# Patient Record
Sex: Male | Born: 1970
Health system: Southern US, Community
[De-identification: ages and names within clinical notes are randomized; demographics above are authoritative.]

## PROBLEM LIST (undated history)

## (undated) DIAGNOSIS — G8929 Other chronic pain: Secondary | ICD-10-CM

## (undated) DIAGNOSIS — R519 Headache, unspecified: Secondary | ICD-10-CM

## (undated) DIAGNOSIS — M2550 Pain in unspecified joint: Secondary | ICD-10-CM

## (undated) DIAGNOSIS — Z8669 Personal history of other diseases of the nervous system and sense organs: Secondary | ICD-10-CM

## (undated) DIAGNOSIS — K219 Gastro-esophageal reflux disease without esophagitis: Secondary | ICD-10-CM

## (undated) DIAGNOSIS — M549 Dorsalgia, unspecified: Secondary | ICD-10-CM

## (undated) DIAGNOSIS — M1990 Unspecified osteoarthritis, unspecified site: Secondary | ICD-10-CM

## (undated) DIAGNOSIS — R51 Headache: Secondary | ICD-10-CM

## (undated) HISTORY — PX: KNEE SURGERY: SHX244

## (undated) HISTORY — PX: INGUINAL HERNIA REPAIR: SUR1180

---

## 2000-08-26 ENCOUNTER — Encounter: Payer: Self-pay | Admitting: Emergency Medicine

## 2000-08-26 ENCOUNTER — Emergency Department (HOSPITAL_COMMUNITY): Admission: EM | Admit: 2000-08-26 | Discharge: 2000-08-26 | Payer: Self-pay | Admitting: Emergency Medicine

## 2008-03-26 ENCOUNTER — Emergency Department (HOSPITAL_COMMUNITY): Admission: EM | Admit: 2008-03-26 | Discharge: 2008-03-27 | Payer: Self-pay | Admitting: Emergency Medicine

## 2009-09-07 ENCOUNTER — Emergency Department (HOSPITAL_COMMUNITY): Admission: EM | Admit: 2009-09-07 | Discharge: 2009-09-08 | Payer: Self-pay | Admitting: Emergency Medicine

## 2009-10-22 ENCOUNTER — Ambulatory Visit (HOSPITAL_BASED_OUTPATIENT_CLINIC_OR_DEPARTMENT_OTHER): Admission: RE | Admit: 2009-10-22 | Discharge: 2009-10-22 | Payer: Self-pay | Admitting: Orthopedic Surgery

## 2010-09-02 ENCOUNTER — Emergency Department: Payer: Self-pay | Admitting: Unknown Physician Specialty

## 2011-03-15 LAB — POCT HEMOGLOBIN-HEMACUE: Hemoglobin: 14.6 g/dL (ref 13.0–17.0)
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
## 2011-06-19 ENCOUNTER — Emergency Department (HOSPITAL_COMMUNITY)
Admission: EM | Admit: 2011-06-19 | Discharge: 2011-06-19 | Disposition: A | Discharge status: Home / Self Care | Payer: Self-pay | Attending: Emergency Medicine | Admitting: Emergency Medicine

## 2011-06-19 DIAGNOSIS — M545 Low back pain, unspecified: Secondary | ICD-10-CM | POA: Insufficient documentation

## 2011-06-27 ENCOUNTER — Emergency Department (HOSPITAL_COMMUNITY): Payer: Self-pay

## 2011-06-27 ENCOUNTER — Emergency Department (HOSPITAL_COMMUNITY)
Admission: EM | Admit: 2011-06-27 | Discharge: 2011-06-27 | Disposition: A | Discharge status: Home / Self Care | Payer: Self-pay | Attending: Emergency Medicine | Admitting: Emergency Medicine

## 2011-06-27 DIAGNOSIS — I498 Other specified cardiac arrhythmias: Secondary | ICD-10-CM | POA: Insufficient documentation

## 2011-06-27 DIAGNOSIS — M5126 Other intervertebral disc displacement, lumbar region: Secondary | ICD-10-CM | POA: Insufficient documentation

## 2011-09-05 LAB — POCT I-STAT, CHEM 8
Calcium, Ion: 1.11 — ABNORMAL LOW
Chloride: 104
HCT: 44
Sodium: 137
TCO2: 24

## 2011-09-05 LAB — CBC
HCT: 41.3
Hemoglobin: 14
RDW: 16.2 — ABNORMAL HIGH
WBC: 6.7

## 2011-09-05 LAB — TYPE AND SCREEN

## 2011-09-05 LAB — PROTIME-INR: INR: 1

## 2011-09-05 LAB — ABO/RH: ABO/RH(D): O POS

## 2015-05-26 ENCOUNTER — Emergency Department (HOSPITAL_COMMUNITY)
Admission: EM | Admit: 2015-05-26 | Discharge: 2015-05-26 | Disposition: A | Discharge status: Home / Self Care | Payer: Self-pay | Attending: Emergency Medicine | Admitting: Emergency Medicine

## 2015-05-26 ENCOUNTER — Emergency Department (HOSPITAL_COMMUNITY): Payer: Self-pay

## 2015-05-26 ENCOUNTER — Encounter (HOSPITAL_COMMUNITY): Payer: Self-pay | Admitting: *Deleted

## 2015-05-26 DIAGNOSIS — R05 Cough: Secondary | ICD-10-CM | POA: Insufficient documentation

## 2015-05-26 DIAGNOSIS — R079 Chest pain, unspecified: Secondary | ICD-10-CM | POA: Insufficient documentation

## 2015-05-26 DIAGNOSIS — Z720 Tobacco use: Secondary | ICD-10-CM | POA: Insufficient documentation

## 2015-05-26 DIAGNOSIS — R0689 Other abnormalities of breathing: Secondary | ICD-10-CM | POA: Insufficient documentation

## 2015-05-26 DIAGNOSIS — J029 Acute pharyngitis, unspecified: Secondary | ICD-10-CM | POA: Insufficient documentation

## 2015-05-26 LAB — CBC
HEMATOCRIT: 39.3 % (ref 39.0–52.0)
HEMOGLOBIN: 12.8 g/dL — AB (ref 13.0–17.0)
MCH: 28.9 pg (ref 26.0–34.0)
MCHC: 32.6 g/dL (ref 30.0–36.0)
MCV: 88.7 fL (ref 78.0–100.0)
Platelets: 227 10*3/uL (ref 150–400)
RBC: 4.43 MIL/uL (ref 4.22–5.81)
RDW: 14.8 % (ref 11.5–15.5)
WBC: 7.5 10*3/uL (ref 4.0–10.5)

## 2015-05-26 LAB — BASIC METABOLIC PANEL
ANION GAP: 7 (ref 5–15)
BUN: 14 mg/dL (ref 6–20)
CALCIUM: 9.3 mg/dL (ref 8.9–10.3)
CO2: 27 mmol/L (ref 22–32)
Chloride: 104 mmol/L (ref 101–111)
Creatinine, Ser: 0.93 mg/dL (ref 0.61–1.24)
GFR calc Af Amer: >60 mL/min (ref >60)
GLUCOSE: 100 mg/dL — AB (ref 65–99)
POTASSIUM: 4 mmol/L (ref 3.5–5.1)
SODIUM: 138 mmol/L (ref 135–145)

## 2015-05-26 LAB — I-STAT TROPONIN, ED: Troponin i, poc: 0 ng/mL (ref 0.00–0.08)

## 2015-05-26 LAB — D-DIMER, QUANTITATIVE: D-Dimer, Quant: 0.64 ug/mL-FEU — ABNORMAL HIGH (ref 0.00–0.48)

## 2015-05-26 MED ORDER — ONDANSETRON HCL 4 MG/2ML IJ SOLN
4.0000 mg | Freq: Once | INTRAMUSCULAR | Status: AC
  Administered 2015-05-26: 4 mg via INTRAVENOUS
  Filled 2015-05-26: qty 2

## 2015-05-26 MED ORDER — OXYCODONE HCL 5 MG PO TABS
5.0000 mg | ORAL_TABLET | Freq: Once | ORAL | Status: DC
Start: 2015-05-26 — End: 2015-05-26
  Filled 2015-05-26: qty 1

## 2015-05-26 MED ORDER — MORPHINE SULFATE 4 MG/ML IJ SOLN
4.0000 mg | Freq: Once | INTRAMUSCULAR | Status: AC
  Administered 2015-05-26: 4 mg via INTRAVENOUS
  Filled 2015-05-26: qty 1

## 2015-05-26 MED ORDER — GI COCKTAIL ~~LOC~~
30.0000 mL | Freq: Once | ORAL | Status: AC
  Administered 2015-05-26: 30 mL via ORAL
  Filled 2015-05-26: qty 30

## 2015-05-26 MED ORDER — OXYCODONE-ACETAMINOPHEN 5-325 MG PO TABS
1.0000 | ORAL_TABLET | Freq: Once | ORAL | Status: AC
  Administered 2015-05-26: 1 via ORAL
  Filled 2015-05-26: qty 1

## 2015-05-26 MED ORDER — TRAMADOL HCL 50 MG PO TABS: 50.0000 mg | ORAL_TABLET | Freq: Four times a day (QID) | ORAL | Status: DC | PRN

## 2015-05-26 MED ORDER — FAMOTIDINE 20 MG PO TABS: 20.0000 mg | ORAL_TABLET | Freq: Two times a day (BID) | ORAL | Status: DC

## 2015-05-26 MED ORDER — IOHEXOL 350 MG/ML SOLN
100.0000 mL | Freq: Once | INTRAVENOUS | Status: AC | PRN
  Administered 2015-05-26: 100 mL via INTRAVENOUS

## 2015-05-26 MED ORDER — SUCRALFATE 1 G PO TABS: 1.0000 g | ORAL_TABLET | Freq: Three times a day (TID) | ORAL | Status: DC

## 2015-05-26 NOTE — ED Provider Notes (Signed)
CSN: [id number]     Arrival date & time 05/26/15  1559 History   First MD Initiated Contact with Patient 05/26/15 1700     Chief Complaint  Patient presents with  . Chest Pain     (Consider location/radiation/quality/duration/timing/severity/associated sxs/prior Treatment) HPI Sergio Boyd is a 44 y.o. male with no medical problems presents to emergency department complaining of chest pain. Patient states that his pain started 3 days ago. It worsened today. He reports pain in the center of his chest, radiating into his neck and back. States pain is worse with movement, deep breathing, coughing, even with swallowing and eating. He states initially pain intermittent, now it is constant. Patient denies any swelling or pain in his extremities. He denies any recent travel or surgeries. He is a smoker. He has no prior medical problems and no family history of cardiac problems. He has not tried anything for pain. He rates pain as 9 out of 10. It is sharp. Denies associated shortness of breath. No nausea vomiting diarrhea. No cough.  History reviewed. No pertinent past medical history. Past Surgical History  Procedure Laterality Date  . Knee surgery      right ACL 2012   History reviewed. No pertinent family history. History  Substance Use Topics  . Smoking status: Never Smoker   . Smokeless tobacco: Not on file  . Alcohol Use: Yes    Review of Systems  Constitutional: Negative for fever and chills.  HENT: Positive for sore throat.   Respiratory: Positive for chest tightness. Negative for cough and shortness of breath.   Cardiovascular: Positive for chest pain. Negative for palpitations and leg swelling.  Gastrointestinal: Negative for nausea, vomiting, abdominal pain, diarrhea and abdominal distention.  Genitourinary: Negative for dysuria, urgency, frequency and hematuria.  Musculoskeletal: Negative for myalgias, arthralgias, neck pain and neck stiffness.  Skin: Negative for rash.   Allergic/Immunologic: Negative for immunocompromised state.  Neurological: Negative for dizziness, weakness, light-headedness, numbness and headaches.      Allergies  Tylenol  Home Medications   Prior to Admission medications   Medication Sig Start Date End Date Taking? Authorizing Provider  Tetrahydrozoline HCl (VISINE OP) Apply 1-2 drops to eye daily as needed (dry eyes).   Yes Historical Provider, MD   BP 128/71 mmHg  Pulse 70  Temp(Src) 98.2 F (36.8 C) (Oral)  Resp 19  SpO2 99% Physical Exam  Constitutional: He appears well-developed and well-nourished. No distress.  HENT:  Head: Normocephalic and atraumatic.  Eyes: Conjunctivae are normal.  Neck: Neck supple.  Cardiovascular: Normal rate, regular rhythm, normal heart sounds and intact distal pulses.   No murmur heard. Pulmonary/Chest: Effort normal. No respiratory distress. He has no wheezes. He has no rales. He exhibits no tenderness.  Abdominal: Soft. Bowel sounds are normal. He exhibits no distension. There is no tenderness. There is no rebound.  Musculoskeletal: He exhibits no edema.  Neurological: He is alert.  Skin: Skin is warm and dry.  Nursing note and vitals reviewed.   ED Course  Procedures (including critical care time) Labs Review Labs Reviewed  CBC - Abnormal; Notable for the following:    Hemoglobin 12.8 (*)    All other components within normal limits  BASIC METABOLIC PANEL  D-DIMER, QUANTITATIVE (NOT AT [facility])  Rosezena Sensor, ED    Imaging Review Dg Chest 2 View  05/26/2015   CLINICAL DATA:  Chest pain for 3 days.  EXAM: CHEST  2 VIEW  COMPARISON:  None.  FINDINGS: The heart  size and mediastinal contours are within normal limits. Both lungs are clear. The visualized skeletal structures are unremarkable.  IMPRESSION: Normal chest.   Electronically Signed   By: Francene Boyers M.D.   On: 05/26/2015 16:37     EKG Interpretation   Date/Time:  Wednesday May 26 2015 16:11:15  EDT Ventricular Rate:  56 PR Interval:  150 QRS Duration: 89 QT Interval:  406 QTC Calculation: 392 R Axis:   77 Text Interpretation:  Sinus rhythm Borderline T abnormalities, inferior  leads No old tracing to compare Confirmed by [facility]  MD, TREY ([id number]) on  05/26/2015 4:13:50 PM      MDM   Final diagnoses:  Chest pain, unspecified chest pain type    Pt with sharp central chest pain, constant, radiating into throat and back. VS normal. Pt otherwise healthy. Atypical for acs. Will get labs, chest xray, d dimer  6:52 PM D dimer slightly elevated. Discussed with Dr. Ethelda Chick. Will try GI cocktail first  No relief with GI cocktail. Percocet ordered. Will reassess.    8:55 PM Pain not improved with percocet or morphine. CT angio negative. Patient now states that pain is mainly worse when he is laying down flat and when he is swallowing. He states the pain is not in his throat but in his chest every time he swallows. I question whether this could be acid reflux or esophagitis, or gastritis. I discussed again with Dr. Rennis Chris who agrees patient is stable for discharge at this time. Patient's vital signs are stable, he is bradycardic, however sinus bradycardia with no apparent blocks. He is asymptomatic from bradycardia. Blood pressure is normal. I will start him on Pepcid, Carafate, Ultram, follow up with primary care doctor. Return precautions discussed.  Filed Vitals:   05/26/15 1615 05/26/15 2000 05/26/15 2100 05/26/15 2101  BP: 128/71 145/73  131/67  Pulse: 70 52 40 51  Temp: 98.2 F (36.8 C)     TempSrc: Oral     Resp: SpO2: 99% 100% 97% 99%     Jaynie Crumble, PA-C 05/27/15 0004  Doug Sou, MD 05/27/15 1610

## 2015-05-26 NOTE — ED Provider Notes (Signed)
Complains of chest pain anterior radiating to his back onset 2 days ago, constant, nonexertional. Made worse with deep inspiration or coughing or swallowing. Admits to mild shortness of breath. No other associated symptoms. Presently pain is moderate. On exam lungs clear auscultation heart regular rate and rhythm no murmurs rubs abdomen nondistended nontender extremities without edema. Heart score equals 2, story is highly atypical for acute coronary syndrome 6:30 PM pain not improved after treatment with GI cocktail. Percocet oredered  Doug Sou, MD 05/26/15 [phone]

## 2015-05-26 NOTE — ED Notes (Signed)
PA at bedside.

## 2015-05-26 NOTE — Discharge Instructions (Signed)
Start taking pepcid and carafate as prescribed daily. Tramadol for pain. See print out for possible acid reflex. If symptoms continue follow up with primary care doctor, if worsen, return to ER.    Chest Pain (Nonspecific) It is often hard to give a specific diagnosis for the cause of chest pain. There is always a chance that your pain could be related to something serious, such as a heart attack or a blood clot in the lungs. You need to follow up with your health care provider for further evaluation. CAUSES   Heartburn.  Pneumonia or bronchitis.  Anxiety or stress.  Inflammation around your heart (pericarditis) or lung (pleuritis or pleurisy).  A blood clot in the lung.  A collapsed lung (pneumothorax). It can develop suddenly on its own (spontaneous pneumothorax) or from trauma to the chest.  Shingles infection (herpes zoster virus). The chest wall is composed of bones, muscles, and cartilage. Any of these can be the source of the pain.  The bones can be bruised by injury.  The muscles or cartilage can be strained by coughing or overwork.  The cartilage can be affected by inflammation and become sore (costochondritis). DIAGNOSIS  Lab tests or other studies may be needed to find the cause of your pain. Your health care provider may have you take a test called an ambulatory electrocardiogram (ECG). An ECG records your heartbeat patterns over a 24-hour period. You may also have other tests, such as:  Transthoracic echocardiogram (TTE). During echocardiography, sound waves are used to evaluate how blood flows through your heart.  Transesophageal echocardiogram (TEE).  Cardiac monitoring. This allows your health care provider to monitor your heart rate and rhythm in real time.  Holter monitor. This is a portable device that records your heartbeat and can help diagnose heart arrhythmias. It allows your health care provider to track your heart activity for several days, if  needed.  Stress tests by exercise or by giving medicine that makes the heart beat faster. TREATMENT   Treatment depends on what may be causing your chest pain. Treatment may include:  Acid blockers for heartburn.  Anti-inflammatory medicine.  Pain medicine for inflammatory conditions.  Antibiotics if an infection is present.  You may be advised to change lifestyle habits. This includes stopping smoking and avoiding alcohol, caffeine, and chocolate.  You may be advised to keep your head raised (elevated) when sleeping. This reduces the chance of acid going backward from your stomach into your esophagus. Most of the time, nonspecific chest pain will improve within 2-3 days with rest and mild pain medicine.  HOME CARE INSTRUCTIONS   If antibiotics were prescribed, take them as directed. Finish them even if you start to feel better.  For the next few days, avoid physical activities that bring on chest pain. Continue physical activities as directed.  Do not use any tobacco products, including cigarettes, chewing tobacco, or electronic cigarettes.  Avoid drinking alcohol.  Only take medicine as directed by your health care provider.  Follow your health care provider's suggestions for further testing if your chest pain does not go away.  Keep any follow-up appointments you made. If you do not go to an appointment, you could develop lasting (chronic) problems with pain. If there is any problem keeping an appointment, call to reschedule. SEEK MEDICAL CARE IF:   Your chest pain does not go away, even after treatment.  You have a rash with blisters on your chest.  You have a fever. SEEK IMMEDIATE MEDICAL CARE  IF:   You have increased chest pain or pain that spreads to your arm, neck, jaw, back, or abdomen.  You have shortness of breath.  You have an increasing cough, or you cough up blood.  You have severe back or abdominal pain.  You feel nauseous or vomit.  You have severe  weakness.  You faint.  You have chills. This is an emergency. Do not wait to see if the pain will go away. Get medical help at once. Call your local emergency services (911 in [place] Do not drive yourself to the hospital. MAKE SURE YOU:   Understand these instructions.  Will watch your condition.  Will get help right away if you are not doing well or get worse. Document Released: 09/06/2005 Document Revised: 12/02/2013 Document Reviewed: 07/02/2008 [facility] Patient Information 2015 [place], [place]. This information is not intended to replace advice given to you by your health care provider. Make sure you discuss any questions you have with your health care provider.

## 2015-05-26 NOTE — ED Notes (Signed)
Provider aware of HR between 40 -50's. SBP is 145/73. Pt is assymptomatic. Denies dizziness. Also states he does not workout.

## 2015-05-26 NOTE — ED Notes (Signed)
Pt reports central chest pain x3 days, pain 9/10. Denies n/v/d. Denies cough.

## 2015-05-26 NOTE — ED Notes (Signed)
Patient transported to CT 

## 2015-10-20 ENCOUNTER — Encounter: Payer: Self-pay | Admitting: Medical

## 2015-10-20 ENCOUNTER — Ambulatory Visit (INDEPENDENT_AMBULATORY_CARE_PROVIDER_SITE_OTHER): Payer: PRIVATE HEALTH INSURANCE | Admitting: Medical

## 2015-10-20 VITALS — BP 106/80 | HR 62 | Resp 12 | Wt 216.8 lb

## 2015-10-20 DIAGNOSIS — R1031 Right lower quadrant pain: Secondary | ICD-10-CM

## 2015-10-20 DIAGNOSIS — K4090 Unilateral inguinal hernia, without obstruction or gangrene, not specified as recurrent: Secondary | ICD-10-CM | POA: Diagnosis not present

## 2015-10-20 NOTE — Progress Notes (Signed)
Subjective: Chief Complaint  Patient presents with  . Groin pain    sudden onset, "goin on for weeks". haven't taken anything for pain. coughing, walking, sitting up, etc. hurts. Laying down doesn't hurt as bed. Loses sleep sometimes because of it. overall about a 7/10 pain scale   Here as a new patient.   Hasn't been having routine primary care, just would go to the ED for issues.    He notes several week hx/o pain in groin, thinks he has a hernia.   Can get pain with coughing, walking, sitting up.   Sees bulge.   denies any problems with bowels, had daily BM.   No blood or mucous in stool.   No urinary problems, no blood in urine, no odor in urine.  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   Does some heavy lifting on the job occasionally but usually gets somebody to help him.  No fever, no recent weight change.   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  No other aggravating or relieving factors. No other complaint.  Objective: BP 106/80 mmHg  Pulse 62  Resp 12  Wt 216 lb 12.8 oz (98.34 kg)  Gen: wd, wn, nad., AA male Lungs clear Heart RRR normal s1, s2, no murmurs Ext: no edema Abdomen:+bs, soft, right inguinal region with obvious bulge and fullness, tender in same area c/w hernia, otherwise no mass, no organomegaly GU: normal circumcised male, bulge of intestines palpated and tender down into right scrotal sac.  otherwise normal male exam, testes descend without mass, no inguinal lymphadenopathy   Assessment: Encounter Diagnoses  Name Primary?  . Inguinal hernia, right Yes  . Right lower quadrant abdominal pain      Plan: discussed findings.   He does have a symptomatic large right inguinal hernia.   Refer to general surgery ASAP

## 2015-11-08 ENCOUNTER — Ambulatory Visit: Payer: Self-pay | Admitting: General Surgery

## 2015-11-08 NOTE — H&P (Addendum)
Sergio Boyd 11/08/2015 3:46 PM Location: [facility] Patient #: [id number] DOB: 07/11/1971 Single / Language: English / Race: Black or African American Male   History of Present Illness (Sergio Boyd Sergio Boyd; 11/08/2015 4:22 PM) The patient is a 44 year old male.  Note:He is referred by Sergio Boyd, physician's Assistant, because of an enlarging right inguinal hernia. He is noted a bulge for at least a month or 2 that is now extended down into the right hemiscrotum. It's been causing him some groin and lower abdominal pain. No constipation or difficulty with urination. No previous history of hernia. No known family history of hernia disease. He occasionally does some heavy lifting at work. 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  Other Problems (Sergio Boyd, CMA; 11/08/2015 3:46 PM) Back Pain  Past Surgical History (Sergio Boyd, CMA; 11/08/2015 3:46 PM) Knee Surgery Right.  Diagnostic Studies History (Sergio Boyd, CMA; 11/08/2015 3:46 PM) Colonoscopy never  Allergies (Sergio Boyd, CMA; 11/08/2015 3:46 PM) Tylenol *ANALGESICS - NonNarcotic* Nausea, Vomiting.  Medication History (Sergio Boyd, CMA; 11/08/2015 3:46 PM) No Current Medications Medications Reconciled  Social History (Sergio Boyd, CMA; 11/08/2015 3:46 PM) Alcohol use Occasional alcohol use. Caffeine use Tea. No drug use Tobacco use Current some day smoker.  Family History (Sergio Boyd, CMA; 11/08/2015 3:46 PM) First Degree Relatives No pertinent family history    Review of Systems (Sergio Boyd CMA; 11/08/2015 3:46 PM) General Not Present- Appetite Loss, Chills, Fatigue, Fever, Night Sweats, Weight Gain and Weight Loss. Skin Not Present- Change in Wart/Mole, Dryness, Hives, Jaundice, New Lesions, Non-Healing Wounds, Rash and Ulcer. HEENT Not Present- Earache, Hearing Loss, Hoarseness, Nose Bleed, Oral Ulcers, Ringing in the Ears, Seasonal Allergies, Sinus Pain, Sore Throat, Visual  Disturbances, Wears glasses/contact lenses and Yellow Eyes. Respiratory Not Present- Bloody sputum, Chronic Cough, Difficulty Breathing, Snoring and Wheezing. Breast Not Present- Breast Mass, Breast Pain, Nipple Discharge and Skin Changes. Cardiovascular Not Present- Chest Pain, Difficulty Breathing Lying Down, Leg Cramps, Palpitations, Rapid Heart Rate, Shortness of Breath and Swelling of Extremities. Gastrointestinal Not Present- Abdominal Pain, Bloating, Bloody Stool, Change in Bowel Habits, Chronic diarrhea, Constipation, Difficulty Swallowing, Excessive gas, Gets full quickly at meals, Hemorrhoids, Indigestion, Nausea, Rectal Pain and Vomiting. Male Genitourinary Not Present- Blood in Urine, Change in Urinary Stream, Frequency, Impotence, Nocturia, Painful Urination, Urgency and Urine Leakage. Musculoskeletal Present- Back Pain and Joint Stiffness. Not Present- Joint Pain, Muscle Pain, Muscle Weakness and Swelling of Extremities. Neurological Not Present- Decreased Memory, Fainting, Headaches, Numbness, Seizures, Tingling, Tremor, Trouble walking and Weakness. Psychiatric Not Present- Anxiety, Bipolar, Change in Sleep Pattern, Depression, Fearful and Frequent crying. Endocrine Not Present- Cold Intolerance, Excessive Hunger, Hair Changes, Heat Intolerance, Hot flashes and New Diabetes. Hematology Not Present- Easy Bruising, Excessive bleeding, Gland problems, HIV and Persistent Infections.  Vitals (Sergio Boyd CMA; 11/08/2015 3:47 PM) 11/08/2015 3:46 PM Weight: 220.8 lb Height: 70in Body Surface Area: 2.18 m Body Mass Index: 31.68 kg/m  Temp.: 97.5F(Temporal)  Pulse: 78 (Regular)  BP: 128/80 (Sitting, Left Arm, Standard)       Physical Exam (Sergio Isley J. Sergio Boyd; 11/08/2015 4:23 PM) The physical exam findings are as follows: Note:General: Overweight male in NAD. Pleasant and cooperative.  HEENT: /AT, no facial masses  CV: RRR.  CHEST: Breath sounds equal and  clear. Respirations nonlabored.  ABDOMEN: Soft, nontender, no hernias.  GU: Right inguinal bulge that extends down into the scrotum and is reducible, no left inguinal bulge. No testicular masses.  NEUROLOGIC: Alert and   oriented, answers questions appropriately, normal gait and station.  PSYCHIATRIC: Normal mood, affect , and behavior.    Assessment & Plan Sergio Boyd(Sergio Boyd J. Sergio Boyd; 11/08/2015 4:25 PM) RIGHT INGUINAL HERNIA (K40.90) Impression: This is symptomatic and enlarging. It extends down into the scrotum.  Plan: I recommended open right inguinal hernia repair with mesh as an outpatient. I have asked him to stop smoking as this will decrease the risk of wound healing problems and infection rate. I have explained the procedure, risks, and aftercare of inguinal hernia repair. Risks include but are not limited to bleeding, infection, wound problems, anesthesia, recurrence, bladder or intestine injury, urinary retention, testicular dysfunction, chronic pain, mesh problems. He seems to understand and agrees to proceed.  Sergio Peaceodd Makaya [place], Boyd

## 2015-11-22 ENCOUNTER — Encounter (HOSPITAL_COMMUNITY): Payer: Self-pay | Admitting: *Deleted

## 2015-11-23 ENCOUNTER — Ambulatory Visit (HOSPITAL_COMMUNITY)
Admission: RE | Admit: 2015-11-23 | Discharge: 2015-11-23 | Disposition: A | Discharge status: Home / Self Care | Payer: PRIVATE HEALTH INSURANCE | Source: Ambulatory Visit | Attending: General Surgery | Admitting: General Surgery

## 2015-11-23 ENCOUNTER — Ambulatory Visit (HOSPITAL_COMMUNITY): Payer: PRIVATE HEALTH INSURANCE | Admitting: Anesthesiology

## 2015-11-23 ENCOUNTER — Encounter (HOSPITAL_COMMUNITY): Payer: Self-pay | Admitting: Anesthesiology

## 2015-11-23 ENCOUNTER — Other Ambulatory Visit: Payer: Self-pay | Admitting: Surgery

## 2015-11-23 ENCOUNTER — Encounter (HOSPITAL_COMMUNITY)
Admission: RE | Disposition: A | Discharge status: Home / Self Care | Payer: Self-pay | Source: Ambulatory Visit | Attending: General Surgery

## 2015-11-23 DIAGNOSIS — K4090 Unilateral inguinal hernia, without obstruction or gangrene, not specified as recurrent: Secondary | ICD-10-CM | POA: Diagnosis not present

## 2015-11-23 DIAGNOSIS — F17200 Nicotine dependence, unspecified, uncomplicated: Secondary | ICD-10-CM | POA: Diagnosis not present

## 2015-11-23 HISTORY — PX: INSERTION OF MESH: SHX5868

## 2015-11-23 HISTORY — DX: Dorsalgia, unspecified: M54.9

## 2015-11-23 HISTORY — PX: INGUINAL HERNIA REPAIR: SHX194

## 2015-11-23 HISTORY — DX: Personal history of other diseases of the nervous system and sense organs: Z86.69

## 2015-11-23 HISTORY — DX: Pain in unspecified joint: M25.50

## 2015-11-23 HISTORY — DX: Other chronic pain: G89.29

## 2015-11-23 LAB — BASIC METABOLIC PANEL
Anion gap: 6 (ref 5–15)
BUN: 10 mg/dL (ref 6–20)
CALCIUM: 9.1 mg/dL (ref 8.9–10.3)
CO2: 26 mmol/L (ref 22–32)
CREATININE: 0.99 mg/dL (ref 0.61–1.24)
Chloride: 107 mmol/L (ref 101–111)
GFR calc Af Amer: >60 mL/min (ref >60)
GFR calc non Af Amer: >60 mL/min (ref >60)
GLUCOSE: 97 mg/dL (ref 65–99)
Potassium: 4.5 mmol/L (ref 3.5–5.1)
Sodium: 139 mmol/L (ref 135–145)

## 2015-11-23 SURGERY — REPAIR, HERNIA, INGUINAL, ADULT
Anesthesia: General | Site: Groin | Laterality: Right

## 2015-11-23 MED ORDER — EPHEDRINE SULFATE 50 MG/ML IJ SOLN
INTRAMUSCULAR | Status: DC | PRN
  Administered 2015-11-23: 5 mg via INTRAVENOUS

## 2015-11-23 MED ORDER — LIDOCAINE HCL (CARDIAC) 20 MG/ML IV SOLN
INTRAVENOUS | Status: DC | PRN
  Administered 2015-11-23: 100 mg via INTRAVENOUS

## 2015-11-23 MED ORDER — LACTATED RINGERS IV SOLN
INTRAVENOUS | Status: DC
  Administered 2015-11-23 (×3): via INTRAVENOUS

## 2015-11-23 MED ORDER — PROMETHAZINE HCL 25 MG/ML IJ SOLN: 6.2500 mg | INTRAMUSCULAR | Status: DC | PRN

## 2015-11-23 MED ORDER — MEPERIDINE HCL 25 MG/ML IJ SOLN: 6.2500 mg | INTRAMUSCULAR | Status: DC | PRN

## 2015-11-23 MED ORDER — SODIUM CHLORIDE 0.9 % IJ SOLN: 3.0000 mL | INTRAMUSCULAR | Status: DC | PRN

## 2015-11-23 MED ORDER — OXYCODONE HCL 5 MG PO TABS: 5.0000 mg | ORAL_TABLET | ORAL | Status: DC | PRN

## 2015-11-23 MED ORDER — HYDROMORPHONE HCL 1 MG/ML IJ SOLN
INTRAMUSCULAR | Status: AC
  Filled 2015-11-23: qty 1

## 2015-11-23 MED ORDER — OXYCODONE HCL 5 MG/5ML PO SOLN: 5.0000 mg | Freq: Once | ORAL | Status: AC | PRN

## 2015-11-23 MED ORDER — PROPOFOL 10 MG/ML IV BOLUS
INTRAVENOUS | Status: AC
  Filled 2015-11-23: qty 20

## 2015-11-23 MED ORDER — ONDANSETRON HCL 4 MG/2ML IJ SOLN
INTRAMUSCULAR | Status: DC | PRN
  Administered 2015-11-23: 4 mg via INTRAVENOUS

## 2015-11-23 MED ORDER — LIDOCAINE HCL (CARDIAC) 20 MG/ML IV SOLN
INTRAVENOUS | Status: AC
  Filled 2015-11-23: qty 5

## 2015-11-23 MED ORDER — FENTANYL CITRATE (PF) 250 MCG/5ML IJ SOLN
INTRAMUSCULAR | Status: AC
  Filled 2015-11-23: qty 5

## 2015-11-23 MED ORDER — FENTANYL CITRATE (PF) 100 MCG/2ML IJ SOLN
50.0000 ug | INTRAMUSCULAR | Status: AC | PRN
  Administered 2015-11-23: 100 ug via INTRAVENOUS
  Administered 2015-11-23: 50 ug via INTRAVENOUS
  Administered 2015-11-23: 100 ug via INTRAVENOUS

## 2015-11-23 MED ORDER — ROCURONIUM BROMIDE 50 MG/5ML IV SOLN
INTRAVENOUS | Status: AC
  Filled 2015-11-23: qty 1

## 2015-11-23 MED ORDER — MIDAZOLAM HCL 5 MG/5ML IJ SOLN
INTRAMUSCULAR | Status: DC | PRN
  Administered 2015-11-23: 2 mg via INTRAVENOUS

## 2015-11-23 MED ORDER — FENTANYL CITRATE (PF) 100 MCG/2ML IJ SOLN
INTRAMUSCULAR | Status: AC
  Administered 2015-11-23: 100 ug via INTRAVENOUS
  Filled 2015-11-23: qty 2

## 2015-11-23 MED ORDER — ONDANSETRON HCL 4 MG/2ML IJ SOLN
INTRAMUSCULAR | Status: AC
  Filled 2015-11-23: qty 2

## 2015-11-23 MED ORDER — OXYCODONE HCL 5 MG PO TABS
5.0000 mg | ORAL_TABLET | Freq: Once | ORAL | Status: AC | PRN
  Administered 2015-11-23: 5 mg via ORAL

## 2015-11-23 MED ORDER — 0.9 % SODIUM CHLORIDE (POUR BTL) OPTIME
TOPICAL | Status: DC | PRN
  Administered 2015-11-23: 1000 mL

## 2015-11-23 MED ORDER — PROPOFOL 10 MG/ML IV BOLUS
INTRAVENOUS | Status: DC | PRN
  Administered 2015-11-23: 200 mg via INTRAVENOUS

## 2015-11-23 MED ORDER — MIDAZOLAM HCL 2 MG/2ML IJ SOLN
1.0000 mg | INTRAMUSCULAR | Status: DC | PRN
  Administered 2015-11-23: 2 mg via INTRAVENOUS

## 2015-11-23 MED ORDER — MIDAZOLAM HCL 2 MG/2ML IJ SOLN
INTRAMUSCULAR | Status: AC
  Filled 2015-11-23: qty 2

## 2015-11-23 MED ORDER — CEFAZOLIN SODIUM-DEXTROSE 2-3 GM-% IV SOLR
2.0000 g | INTRAVENOUS | Status: AC
  Administered 2015-11-23: 2 g via INTRAVENOUS
  Filled 2015-11-23: qty 50

## 2015-11-23 MED ORDER — OXYCODONE HCL 5 MG PO TABS
ORAL_TABLET | ORAL | Status: AC
  Filled 2015-11-23: qty 1

## 2015-11-23 MED ORDER — BUPIVACAINE-EPINEPHRINE (PF) 0.5% -1:200000 IJ SOLN
INTRAMUSCULAR | Status: AC
  Filled 2015-11-23: qty 30

## 2015-11-23 MED ORDER — HYDROMORPHONE HCL 1 MG/ML IJ SOLN
0.2500 mg | INTRAMUSCULAR | Status: DC | PRN
  Administered 2015-11-23 (×2): 0.5 mg via INTRAVENOUS

## 2015-11-23 MED ORDER — BUPIVACAINE-EPINEPHRINE 0.5% -1:200000 IJ SOLN
INTRAMUSCULAR | Status: DC | PRN
  Administered 2015-11-23: 30 mL

## 2015-11-23 MED ORDER — MIDAZOLAM HCL 2 MG/2ML IJ SOLN
INTRAMUSCULAR | Status: AC
  Administered 2015-11-23: 2 mg via INTRAVENOUS
  Filled 2015-11-23: qty 2

## 2015-11-23 SURGICAL SUPPLY — 49 items
APL SKNCLS STERI-STRIP NONHPOA (GAUZE/BANDAGES/DRESSINGS) ×1
BENZOIN TINCTURE PRP APPL 2/3 (GAUZE/BANDAGES/DRESSINGS) ×2 IMPLANT
BLADE SURG 10 STRL SS (BLADE) ×2 IMPLANT
BLADE SURG 15 STRL LF DISP TIS (BLADE) ×1 IMPLANT
BLADE SURG 15 STRL SS (BLADE) ×2
CHLORAPREP W/TINT 26ML (MISCELLANEOUS) ×2 IMPLANT
COVER SURGICAL LIGHT HANDLE (MISCELLANEOUS) ×2 IMPLANT
DRAPE INCISE IOBAN 66X45 STRL (DRAPES) ×2 IMPLANT
DRAPE LAPAROTOMY TRNSV 102X78 (DRAPE) ×2 IMPLANT
DRAPE UTILITY XL STRL (DRAPES) ×4 IMPLANT
DRSG TEGADERM 4X4.75 (GAUZE/BANDAGES/DRESSINGS) ×2 IMPLANT
DRSG TELFA 3X8 NADH (GAUZE/BANDAGES/DRESSINGS) ×2 IMPLANT
ELECT CAUTERY BLADE 6.4 (BLADE) ×2 IMPLANT
ELECT REM PT RETURN 9FT ADLT (ELECTROSURGICAL) ×2
ELECTRODE REM PT RTRN 9FT ADLT (ELECTROSURGICAL) ×1 IMPLANT
GAUZE SPONGE 4X4 16PLY XRAY LF (GAUZE/BANDAGES/DRESSINGS) ×2 IMPLANT
GLOVE BIOGEL PI IND STRL 7.5 (GLOVE) IMPLANT
GLOVE BIOGEL PI IND STRL 8 (GLOVE) ×1 IMPLANT
GLOVE BIOGEL PI IND STRL 8.5 (GLOVE) IMPLANT
GLOVE BIOGEL PI INDICATOR 7.5 (GLOVE) ×1
GLOVE BIOGEL PI INDICATOR 8 (GLOVE) ×1
GLOVE BIOGEL PI INDICATOR 8.5 (GLOVE) ×1
GLOVE ECLIPSE 8.0 STRL XLNG CF (GLOVE) ×2 IMPLANT
GLOVE SURG SS PI 7.5 STRL IVOR (GLOVE) ×1 IMPLANT
GLOVE SURG SS PI 8.0 STRL IVOR (GLOVE) ×2 IMPLANT
GOWN STRL REUS W/ TWL LRG LVL3 (GOWN DISPOSABLE) ×2 IMPLANT
GOWN STRL REUS W/TWL LRG LVL3 (GOWN DISPOSABLE) ×4
KIT BASIN OR (CUSTOM PROCEDURE TRAY) ×2 IMPLANT
KIT ROOM TURNOVER OR (KITS) ×2 IMPLANT
MESH HERNIA 3X6 (Mesh General) ×1 IMPLANT
NDL HYPO 25GX1X1/2 BEV (NEEDLE) ×1 IMPLANT
NEEDLE HYPO 25GX1X1/2 BEV (NEEDLE) ×2 IMPLANT
NS IRRIG 1000ML POUR BTL (IV SOLUTION) ×2 IMPLANT
PACK SURGICAL SETUP 50X90 (CUSTOM PROCEDURE TRAY) ×2 IMPLANT
PAD ARMBOARD 7.5X6 YLW CONV (MISCELLANEOUS) ×2 IMPLANT
PAD DRESSING TELFA 3X8 NADH (GAUZE/BANDAGES/DRESSINGS) ×1 IMPLANT
PENCIL BUTTON HOLSTER BLD 10FT (ELECTRODE) ×2 IMPLANT
SPONGE LAP 18X18 X RAY DECT (DISPOSABLE) ×2 IMPLANT
STRIP CLOSURE SKIN 1/2X4 (GAUZE/BANDAGES/DRESSINGS) ×2 IMPLANT
SUT MON AB 4-0 PC3 18 (SUTURE) ×2 IMPLANT
SUT PROLENE 2 0 CT2 30 (SUTURE) ×4 IMPLANT
SUT VIC AB 2-0 SH 18 (SUTURE) ×2 IMPLANT
SUT VIC AB 3-0 SH 27 (SUTURE) ×4
SUT VIC AB 3-0 SH 27X BRD (SUTURE) IMPLANT
SUT VIC AB 3-0 SH 27XBRD (SUTURE) ×1 IMPLANT
SUT VICRYL AB 3 0 TIES (SUTURE) ×2 IMPLANT
SYR CONTROL 10ML LL (SYRINGE) ×2 IMPLANT
TOWEL OR 17X24 6PK STRL BLUE (TOWEL DISPOSABLE) ×2 IMPLANT
TOWEL OR 17X26 10 PK STRL BLUE (TOWEL DISPOSABLE) ×2 IMPLANT

## 2015-11-23 NOTE — Op Note (Signed)
OPERATIVE NOTE-INGUINAL HERNIA REPAIR  Preoperative diagnosis:  Right inguinal hernia extending into scrotum  Postoperative diagnosis:  Same  Procedure:  Right inguinal hernia repair with mesh.  Surgeon:  Avel Peaceodd Shafin Pollio, M.D.  Anesthesia:  Gen./LMA with local (Marcaine).  Indication:  This is a 44 year old male who has a right inguinal hernia extending down into his scrotum. He now presents for repair. He had a TAP block in the holding room.  Technique:  He was seen in the holding room and the right groin was marked with my initials. He was brought to the operating, placed supine on the operating table, and the anesthetic was administered by the anesthesiologist. The hair in the right groin area was clipped as was felt to be necessary. This area was then sterilely prepped and draped.  Local anesthetic was infiltrated in the superficial and deep tissues in the right groin.  An incision was made through the skin and subcutaneous tissue until the external oblique aponeurosis was identified.  Local anesthetic was infiltrated deep to the external oblique aponeurosis. The external oblique aponeurosis was divided through the external ring medially and back toward the anterior superior iliac spine laterally. Using blunt dissection, the shelving edge of the inguinal ligament was identified inferiorly and the internal oblique aponeurosis and muscle were identified superiorly. The ilioinguinal nerve was identified and preserved.  The spermatic cord was isolated and a posterior window was made around it. An indirect hernia defect was noted with a large hernia sac densely adherent to the spermatic cord and extending down into the scrotum.  The sac separated from the spermatic cord using blunt dissection. The hernia sac and its contents were reduced through the indirect hernia defect.   A piece of 3" x 6" polypropylene mesh was brought into the field and anchored 1-2 cm medial to the pubic tubercle with 2-0  Prolene suture. The inferior aspect of the mesh was anchored to the shelving edge of the inguinal ligament with running 2-0 Prolene suture to a level 1-2 cm lateral to the internal ring. A slit was cut in the mesh creating 2 tails. These were wrapped around the spermatic cord. The superior aspect of the mesh was anchored to the internal oblique aponeurosis and muscle with interrupted 2-0 Vicryl sutures. The 2 tails of the mesh were then crossed creating a new internal ring and were anchored to the shelving edge of the inguinal ligament with 2-0 Prolene suture. The tip of a hemostat could be placed through the new aperture. The lateral aspect of the mesh was then tucked deep to the external oblique aponeurosis.  The wound was inspected and hemostasis was adequate. The external oblique aponeurosis was then closed over the mesh and cord with running 3-0 Vicryl suture. The subcutaneous tissue was closed with running 3-0 Vicryl suture. The skin closed with a running 4-0 Monocryl subcuticular stitch.  Steri-Strips and a sterile dressing were applied.  The procedure was well-tolerated without any apparent complications and he was taken to the recovery room in satisfactory condition.

## 2015-11-23 NOTE — Interval H&P Note (Signed)
History and Physical Interval Note:  11/23/2015 12:25 PM  Sergio BeaversMiguel Hammontree  has presented today for surgery, with the diagnosis of right inguinal hernia repair with mesh  The various methods of treatment have been discussed with the patient and family. After consideration of risks, benefits and other options for treatment, the patient has consented to  Procedure(s): RIGHT INGUINAL HERNIA REPAIR W/MESH (Right) INSERTION OF MESH (N/A) as a surgical intervention .  The patient's history has been reviewed, patient examined, no change in status, stable for surgery.  I have reviewed the patient's chart and labs.  Questions were answered to the patient's satisfaction.     Zephan Beauchaine JShela Commons

## 2015-11-23 NOTE — Transfer of Care (Signed)
Immediate Anesthesia Transfer of Care Note  Patient: Sergio BeaversMiguel Boyd  Procedure(s) Performed: Procedure(s): RIGHT INGUINAL HERNIA REPAIR W/MESH (Right) INSERTION OF MESH (Right)  Patient Location: PACU  Anesthesia Type:GA combined with regional for post-op pain  Level of Consciousness: awake, alert  and oriented  Airway & Oxygen Therapy: Patient Spontanous Breathing and Patient connected to nasal cannula oxygen  Post-op Assessment: Report given to RN and Post -op Vital signs reviewed and stable  Post vital signs: Reviewed and stable  Last Vitals:  Filed Vitals:   11/23/15 1250 11/23/15 1437  BP: 113/65 118/49  Pulse:  57  Temp:  36.5 C  Resp: 18 17    Complications: No apparent anesthesia complications

## 2015-11-23 NOTE — Anesthesia Postprocedure Evaluation (Signed)
Anesthesia Post Note  Patient: Sergio BeaversMiguel Boyd  Procedure(s) Performed: Procedure(s) (LRB): RIGHT INGUINAL HERNIA REPAIR W/MESH (Right) INSERTION OF MESH (Right)  Patient location during evaluation: PACU Anesthesia Type: General and Regional Level of consciousness: sedated and patient cooperative Pain management: pain level controlled Vital Signs Assessment: post-procedure vital signs reviewed and stable Respiratory status: spontaneous breathing Cardiovascular status: stable Anesthetic complications: no    Last Vitals:  Filed Vitals:   11/23/15 1445 11/23/15 1500  BP: 114/80 115/75  Pulse: 49 45  Temp:    Resp: 10 14    Last Pain:  Filed Vitals:   11/23/15 1514  PainSc: Asleep                 Lewie LoronJohn Jannelly Bergren

## 2015-11-23 NOTE — Anesthesia Preprocedure Evaluation (Addendum)
Anesthesia Evaluation  Patient identified by MRN, date of birth, ID band Patient awake    Reviewed: Allergy & Precautions, NPO status , Patient's Chart, lab work & pertinent test results  Airway Mallampati: II  TM Distance: >3 FB Neck ROM: Full    Dental no notable dental hx.    Pulmonary Current Smoker,    Pulmonary exam normal breath sounds clear to auscultation       Cardiovascular negative cardio ROS Normal cardiovascular exam Rhythm:Regular Rate:Normal     Neuro/Psych negative neurological ROS  negative psych ROS   GI/Hepatic negative GI ROS, Neg liver ROS,   Endo/Other  negative endocrine ROS  Renal/GU negative Renal ROS     Musculoskeletal negative musculoskeletal ROS (+)   Abdominal   Peds  Hematology negative hematology ROS (+)   Anesthesia Other Findings   Reproductive/Obstetrics                             Anesthesia Physical Anesthesia Plan  ASA: II  Anesthesia Plan: General   Post-op Pain Management:    Induction: Intravenous  Airway Management Planned: LMA  Additional Equipment:   Intra-op Plan:   Post-operative Plan: Extubation in OR  Informed Consent: I have reviewed the patients History and Physical, chart, labs and discussed the procedure including the risks, benefits and alternatives for the proposed anesthesia with the patient or authorized representative who has indicated his/her understanding and acceptance.   Dental advisory given  Plan Discussed with: CRNA  Anesthesia Plan Comments:         Anesthesia Quick Evaluation

## 2015-11-23 NOTE — H&P (View-Only) (Signed)
[place]. [place] 11/08/2015 3:46 PM Location: [facility] Patient #: [id number] DOB: 12-Dec-1970 Single / Language: [place] / Race: Black or African American Male   History of Present Illness [name] MD; 11/08/2015 4:22 PM) The patient is a 44 year old male.  Note:He is referred by Crosby Oyster, physician's Assistant, because of an enlarging right inguinal hernia. He is noted a bulge for at least a month or 2 that is now extended down into the right hemiscrotum. It's been causing him some groin and lower abdominal pain. No constipation or difficulty with urination. No previous history of hernia. No known family history of hernia disease. He occasionally does some heavy lifting at work. 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  Other Problems Fay Records, CMA; 11/08/2015 3:46 PM) Back Pain  Past Surgical History Fay Records, CMA; 11/08/2015 3:46 PM) Knee Surgery Right.  Diagnostic Studies History Fay Records, [place]; 11/08/2015 3:46 PM) Colonoscopy never  Allergies Fay Records, CMA; 11/08/2015 3:46 PM) Tylenol *ANALGESICS - NonNarcotic* Nausea, Vomiting.  Medication History Fay Records, [place]; 11/08/2015 3:46 PM) No Current Medications Medications Reconciled  Social History Fay Records, [place]; 11/08/2015 3:46 PM) Alcohol use Occasional alcohol use. Caffeine use Tea. No drug use Tobacco use Current some day smoker.  Family History Fay Records, [place]; 11/08/2015 3:46 PM) First Degree Relatives No pertinent family history    Review of Systems Fay Records CMA; 11/08/2015 3:46 PM) General Not Present- Appetite Loss, Chills, Fatigue, Fever, Night Sweats, Weight Gain and Weight Loss. Skin Not Present- Change in Wart/Mole, Dryness, Hives, Jaundice, New Lesions, Non-Healing Wounds, Rash and Ulcer. HEENT Not Present- Earache, Hearing Loss, Hoarseness, Nose Bleed, Oral Ulcers, Ringing in the Ears, Seasonal Allergies, Sinus Pain, Sore Throat, Visual  Disturbances, Wears glasses/contact lenses and Yellow Eyes. Respiratory Not Present- Bloody sputum, Chronic Cough, Difficulty Breathing, Snoring and Wheezing. Breast Not Present- Breast Mass, Breast Pain, Nipple Discharge and Skin Changes. Cardiovascular Not Present- Chest Pain, Difficulty Breathing Lying Down, Leg Cramps, Palpitations, Rapid Heart Rate, Shortness of Breath and Swelling of Extremities. Gastrointestinal Not Present- Abdominal Pain, Bloating, Bloody Stool, Change in Bowel Habits, Chronic diarrhea, Constipation, Difficulty Swallowing, Excessive gas, Gets full quickly at meals, Hemorrhoids, Indigestion, Nausea, Rectal Pain and Vomiting. Male Genitourinary Not Present- Blood in Urine, Change in Urinary Stream, Frequency, Impotence, Nocturia, Painful Urination, Urgency and Urine Leakage. Musculoskeletal Present- Back Pain and Joint Stiffness. Not Present- Joint Pain, Muscle Pain, Muscle Weakness and Swelling of Extremities. Neurological Not Present- Decreased Memory, Fainting, Headaches, Numbness, Seizures, Tingling, Tremor, Trouble walking and Weakness. Psychiatric Not Present- Anxiety, Bipolar, Change in Sleep Pattern, Depression, Fearful and Frequent crying. Endocrine Not Present- Cold Intolerance, Excessive Hunger, Hair Changes, Heat Intolerance, Hot flashes and New Diabetes. Hematology Not Present- Easy Bruising, Excessive bleeding, Gland problems, HIV and Persistent Infections.  Vitals Fay Records CMA; 11/08/2015 3:47 PM) 11/08/2015 3:46 PM Weight: 220.8 lb Height: 70in Body Surface Area: 2.18 m Body Mass Index: 31.68 kg/m  Temp.: 97.59F(Temporal)  Pulse: 78 (Regular)  BP: 128/80 (Sitting, Left Arm, Standard)       Physical Exam [name] MD; 11/08/2015 4:23 PM) The physical exam findings are as follows: Note:General: Overweight male in NAD. Pleasant and cooperative.  HEENT: Sac/AT, no facial masses  CV: RRR.  CHEST: Breath sounds equal and  clear. Respirations nonlabored.  ABDOMEN: Soft, nontender, no hernias.  GU: Right inguinal bulge that extends down into the scrotum and is reducible, no left inguinal bulge. No testicular masses.  NEUROLOGIC: Alert and  oriented, answers questions appropriately, normal gait and station.  PSYCHIATRIC: Normal mood, affect , and behavior.    Assessment & Plan [name](Hermela Hardt J. Govind Furey MD; 11/08/2015 4:25 PM) RIGHT INGUINAL HERNIA (K40.90) Impression: This is symptomatic and enlarging. It extends down into the scrotum.  Plan: I recommended open right inguinal hernia repair with mesh as an outpatient. I have asked him to stop smoking as this will decrease the risk of wound healing problems and infection rate. I have explained the procedure, risks, and aftercare of inguinal hernia repair. Risks include but are not limited to bleeding, infection, wound problems, anesthesia, recurrence, bladder or intestine injury, urinary retention, testicular dysfunction, chronic pain, mesh problems. He seems to understand and agrees to proceed.  Avel Peaceodd Jensyn Cambria, MD

## 2015-11-23 NOTE — Discharge Instructions (Signed)
[facility] _______[facility]   INGUINAL HERNIA REPAIR: POST OP INSTRUCTIONS  Always review your discharge instruction sheet given to you by the facility where your surgery was performed. IF YOU HAVE DISABILITY OR FAMILY LEAVE FORMS, YOU MUST BRING THEM TO THE OFFICE FOR PROCESSING.   DO NOT GIVE THEM TO YOUR DOCTOR.  1. A  prescription for pain medication may be given to you upon discharge.  Take your pain medication as prescribed, if needed.  If narcotic pain medicine is not needed, then you may take acetaminophen (Tylenol) or ibuprofen (Advil) as needed. 2. Take your usually prescribed medications unless otherwise directed. 3. If you need a refill on your pain medication, please contact your pharmacy.  They will contact our office to request authorization. Prescriptions will not be filled after 5 pm or on week-ends. 4. You should follow a light diet the first 24 hours after arrival home, such as soup and crackers, etc.  Be sure to include lots of fluids daily.  Resume your normal diet the day after surgery. 5. Most patients will experience some swelling and bruising  in the groin and scrotum.  Ice packs and reclining will help.  Swelling and bruising can take several days or weeks to resolve.  6. It is common to experience some constipation if taking pain medication after surgery.  Increasing fluid intake and taking a stool softener (such as Colace) will usually help or prevent this problem from occurring.  A mild laxative (Milk of Magnesia or Miralax) should be taken according to package directions if there are no bowel movements after 48 hours. 7. Unless discharge instructions indicate otherwise, you may remove your bandages 72 hours after surgery.  You may shower the day after surgery.  You may have steri-strips (small skin tapes) in place directly over the incision.  These strips should be left on the skin until they fall off.  If your surgeon used skin glue on the incision, you may  shower in 24 hours.  The glue will flake off over the next 2-3 weeks.  Any sutures or staples will be removed at the office during your follow-up visit. 8. ACTIVITIES:  You may resume regular (light) daily activities beginning the next day--such as daily self-care, walking, climbing stairs--gradually increasing activities as tolerated.  You may have sexual intercourse when it is comfortable.  Refrain from any heavy lifting or straining-nothing over 10 pounds for 6 weeks. a. You may drive when you are no longer taking prescription pain medication, you can comfortably wear a seatbelt, and you can safely maneuver your car and apply brakes. b. RETURN TO WORK:  Light work in 1-2 weeks.  Full duty in 6 weeks if pain free.__________________________________________________________ 9. You should see your doctor in the office for a follow-up appointment approximately 2-3 weeks after your surgery.  Make sure that you call for this appointment within a day or two after you arrive home to insure a convenient appointment time. 10. OTHER INSTRUCTIONS:  __________________________________________________________________________________________________________________________________________________________________________________________  WHEN TO CALL YOUR DOCTOR: 1. Fever over 101.0 2. Inability to urinate 3. Nausea and/or vomiting 4. Extreme swelling or bruising 5. Continued bleeding from incision. 6. Increased pain, redness, or drainage from the incision  The clinic staff is available to answer your questions during regular business hours.  Please dont hesitate to call and ask to speak to one of the nurses for clinical concerns.  If you have a medical emergency, go to the nearest emergency room or call 911.  A  surgeon from [facility] is always on call at the hospital   [address], [address], [place], [place]  [address] ?  [address] [address], [place], [place]   [address] [phone] ?  [phone] ? FAX [phone] Web site: [email or web address]

## 2015-11-23 NOTE — Anesthesia Procedure Notes (Addendum)
Anesthesia Regional Block:  TAP block  Pre-Anesthetic Checklist: ,, timeout performed, Correct Patient, Correct Site, Correct Laterality, Correct Procedure, Correct Position, site marked, Risks and benefits discussed, Surgical consent,  Pre-op evaluation,  Post-op pain management  Laterality: Right  Prep: chloraprep       Needles:   Needle Type: Stimiplex     Needle Length: 9cm 9 cm     Additional Needles:  Procedures: ultrasound guided (picture in chart) TAP block Narrative:  Injection made incrementally with aspirations every 5 mL.  Performed by: Personally  Anesthesiologist: Lewie LoronGERMEROTH, Latroya Ng  Additional Notes: Patient tolerated well. Good fascial spread noted.   Procedure Name: LMA Insertion Date/Time: 11/23/2015 1:12 PM Performed by: Margaree MackintoshYACOUB, ALISHA B Pre-anesthesia Checklist: Patient identified, Emergency Drugs available, Suction available, Patient being monitored and Timeout performed Patient Re-evaluated:Patient Re-evaluated prior to inductionOxygen Delivery Method: Circle system utilized Preoxygenation: Pre-oxygenation with 100% oxygen Intubation Type: IV induction LMA: LMA inserted LMA Size: 4.0 Number of attempts: 1 Tube secured with: Tape Dental Injury: Teeth and Oropharynx as per pre-operative assessment  Comments: LMA inserted by Everlene Ballshristine Hayes, CRNA

## 2015-11-24 ENCOUNTER — Emergency Department (HOSPITAL_COMMUNITY)
Admission: EM | Admit: 2015-11-24 | Discharge: 2015-11-24 | Disposition: A | Discharge status: Home / Self Care | Payer: PRIVATE HEALTH INSURANCE | Attending: Emergency Medicine | Admitting: Emergency Medicine

## 2015-11-24 ENCOUNTER — Encounter (HOSPITAL_COMMUNITY): Payer: Self-pay | Admitting: Family Medicine

## 2015-11-24 DIAGNOSIS — Z8739 Personal history of other diseases of the musculoskeletal system and connective tissue: Secondary | ICD-10-CM | POA: Diagnosis not present

## 2015-11-24 DIAGNOSIS — G8929 Other chronic pain: Secondary | ICD-10-CM | POA: Insufficient documentation

## 2015-11-24 DIAGNOSIS — R001 Bradycardia, unspecified: Secondary | ICD-10-CM | POA: Diagnosis not present

## 2015-11-24 DIAGNOSIS — G8918 Other acute postprocedural pain: Secondary | ICD-10-CM | POA: Diagnosis present

## 2015-11-24 DIAGNOSIS — R1030 Lower abdominal pain, unspecified: Secondary | ICD-10-CM | POA: Diagnosis not present

## 2015-11-24 DIAGNOSIS — F17200 Nicotine dependence, unspecified, uncomplicated: Secondary | ICD-10-CM | POA: Diagnosis not present

## 2015-11-24 DIAGNOSIS — Z8679 Personal history of other diseases of the circulatory system: Secondary | ICD-10-CM | POA: Diagnosis not present

## 2015-11-24 MED ORDER — OXYCODONE HCL 5 MG PO TABS: 5.0000 mg | ORAL_TABLET | ORAL | Status: DC | PRN

## 2015-11-24 MED ORDER — OXYCODONE HCL 5 MG PO TABS
5.0000 mg | ORAL_TABLET | Freq: Once | ORAL | Status: AC
Start: 2015-11-24 — End: 2015-11-24
  Administered 2015-11-24: 5 mg via ORAL
  Filled 2015-11-24: qty 1

## 2015-11-24 MED ORDER — HYDROMORPHONE HCL 1 MG/ML IJ SOLN
1.0000 mg | Freq: Once | INTRAMUSCULAR | Status: AC
  Administered 2015-11-24: 1 mg via INTRAMUSCULAR
  Filled 2015-11-24: qty 1

## 2015-11-24 NOTE — ED Provider Notes (Signed)
CSN: [id number]     Arrival date & time 11/24/15  0807 History   First MD Initiated Contact with Patient 11/24/15 0813     Chief Complaint  Patient presents with  . Inguinal Hernia     (Consider location/radiation/quality/duration/timing/severity/associated sxs/prior Treatment) HPI Comments: Patient presents to the ED with a chief complaint of post-op pain.  He states that he had a R inguinal hernia repair yesterday.  Was prescribed oxycodone 5 mg tablets, but the pharmacy did not fill the prescription because there was 2 different colors of ink on the prescription.  Also concern that there was a 1 written in front of the 5 mg.  Patient complains of moderate to severe pain in groin.  Denies any fevers, chills, or discharge.  The history is provided by the patient. No language interpreter was used.    Past Medical History  Diagnosis Date  . History of migraine     last one a month ago  . Joint pain   . Chronic back pain     herniated disc   Past Surgical History  Procedure Laterality Date  . Knee surgery      right ACL 2012  . Inguinal hernia repair Right    No family history on file. Social History  Substance Use Topics  . Smoking status: Current Every Day Smoker -- 0.25 packs/day for 10 years  . Smokeless tobacco: None  . Alcohol Use: Yes     Comment: socially     Review of Systems  Constitutional: Negative for fever and chills.  Respiratory: Negative for shortness of breath.   Cardiovascular: Negative for chest pain.  Gastrointestinal: Negative for nausea, vomiting, diarrhea and constipation.  Genitourinary: Negative for dysuria.       Groin pain      Allergies  Tylenol  Home Medications   Prior to Admission medications   Medication Sig Start Date End Date Taking? Authorizing Provider  Naproxen Sodium (ALEVE) 220 MG CAPS Take 220 mg by mouth 2 (two) times daily as needed (pain).   Yes Historical Provider, MD  Phenyleph-Doxylamine-DM-APAP (ALKA SELTZER PLUS  PO) Take 1 capsule by mouth 3 (three) times daily as needed (cold symptoms).   Yes Historical Provider, MD  Tetrahydrozoline HCl (VISINE OP) Apply 1-2 drops to eye daily as needed (dry eyes).   Yes Historical Provider, MD  oxyCODONE (OXY IR/ROXICODONE) 5 MG immediate release tablet Take 1-2 tablets (5-10 mg total) by mouth every 4 (four) hours as needed for moderate pain, severe pain or breakthrough pain. 11/23/15   Abigail Miyamoto, MD   BP 120/92 mmHg  Pulse 54  Temp(Src) 98.5 F (36.9 C) (Oral)  Resp 17  Ht  (1.778 m)  Wt 99.791 kg  BMI 31.57 kg/m2  SpO2 100% Physical Exam  Constitutional: He is oriented to person, place, and time. He appears well-developed and well-nourished.  HENT:  Head: Normocephalic and atraumatic.  Eyes: Conjunctivae and EOM are normal. Pupils are equal, round, and reactive to light. Right eye exhibits no discharge. Left eye exhibits no discharge. No scleral icterus.  Neck: Normal range of motion. Neck supple. No JVD present.  Cardiovascular: Regular rhythm and normal heart sounds.  Exam reveals no gallop and no friction rub.   No murmur heard. bradycardic  Pulmonary/Chest: Effort normal and breath sounds normal. No respiratory distress. He has no wheezes. He has no rales. He exhibits no tenderness.  Abdominal: Soft. He exhibits no distension and no mass. There is no tenderness. There is  no rebound and no guarding.  Musculoskeletal: Normal range of motion. He exhibits no edema or tenderness.  Neurological: He is alert and oriented to person, place, and time.  Skin: Skin is warm and dry.  Incision to right inguinal canal, no discharge, no sign of infection  Psychiatric: He has a normal mood and affect. His behavior is normal. Judgment and thought content normal.  Nursing note and vitals reviewed.   ED Course  Procedures (including critical care time)   MDM   Final diagnoses:  Post-op pain    Patient with post op pain from inguinal hernia  surgery yesterday.  Unable to get script filled over concern for fraudulence. Dr. Abbey Chattersosenbower called and spoke with Dr. Madilyn Hookees.  States that he only used one color pen and wrote for 5 mg tablets.  Dr. Abbey Chattersosenbower states that the patient does have real pain, and to treat as we are comfortable.  I will give the patient a shot of dilaudid and medication for today.  He will need to sort out the rest of his pain management with Dr. Abbey Chattersosenbower as this situation is all very suspicious and refilling the meds outright would be in violation of ED pain policy.  Nevertheless, one day prescription will be given.    Roxy HorsemanRobert Treyson Axel, PA-C 11/24/15 [id number]  Tilden FossaElizabeth Rees, MD 11/25/15 0900

## 2015-11-24 NOTE — Discharge Instructions (Signed)
Pain Relief Preoperatively and Postoperatively  If you have questions, problems, or concerns about the pain that you may feel after surgery, let your health care provider know. Patients have the right to assessment and management of pain. Severe pain after surgery--and the fear or anxiety associated with that pain--may cause extreme discomfort that:  · Prevents sleep.  · Decreases the ability to breathe deeply and to cough. This can result in pneumonia or other upper airway infections.  · Causes the heart to beat more quickly and the blood pressure to be higher.  · Increases the risk for constipation and bloating.  · Decreases the ability of wounds to heal.  · May result in depression, increased anxiety, and feelings of helplessness.  Relieving pain before surgery (preoperatively) is also important because it lessens pain that you have after surgery (postoperatively). Patients who receive pain relief both before and after surgery experience greater pain relief than those who receive pain relief only after surgery. Let your health care provider know if you are having uncontrolled pain. This is very important. Pain after surgery is more difficult to manage if it is severe, so receiving prompt and adequate treatment of acute pain is necessary. If you become constipated after taking pain medicine, drink more liquids if you can. Your health care provider may have you take a mild laxative.  PAIN CONTROL METHODS  Your health care providers follow policies and procedures about the management of your pain. These guidelines should be explained to you before surgery. Plans for pain control after surgery must be decided upon by you and your health care provider and put into use with your full understanding and agreement. Do not be afraid to ask questions about the care that you are receiving.  Your health care providers will attempt to control your pain in various ways, and these methods may be used together (multimodal  analgesia). Using this approach has many benefits for you, including being able to eat, move around, and leave the hospital sooner.  As-Needed Pain Control  · You may be given pain medicine through an IV tube or as a pill or liquid that you can swallow. Let your health care provider know when you are having pain, and he or she will give you the pain medicine that is ordered for you.  IV Patient-Controlled Analgesia (PCA) Pump  · You can receive your pain medicine through an IV tube that goes into one of your veins. You can control the amount of pain medicine that you get. The pain medicine is controlled by a pump. When you push the button that is hooked up to this pump, you receive a specific amount of pain medicine. This button should be pushed only by you or by someone who is specifically assigned by you to do so. It is set up to keep you from accidentally giving yourself too much pain medicine. You will be able to start using your pain pump in the recovery room after your surgery. This method can be helpful for most types of surgery.  · Tell your health care provider:    If you are having too much pain.    If you are feeling too sleepy or nauseous.  Continuous Epidural Pain Control  · A thin, soft tube (catheter) is put into your back, outside the outer layer of your spinal cord. Pain medicine flows through the catheter to lessen pain in areas of your body that are below the level of catheter placement. Continuous epidural pain control may work best for you   if you are having surgery on your abdomen, hip area, or legs. The epidural catheter is usually put into your back shortly before surgery. It is left in until you can eat, take medicine by mouth, pass urine, and have a bowel movement.  · Giving pain medicine through the epidural catheter may help you to heal more quickly because you can do these things sooner:    Regain normal bowel and bladder function.    Return to eating.    Get up and walk.  Medicine That  Numbs the Area (Local Anesthetic)  You may be given pain medicine:  · As an injection near the area of the pain (local infiltration).  · As an injection near the nerve that controls the sensation to a specific part of your body (peripheral nerve block).  · In your spine to block pain (spinal block).  · Through a local anesthetic reservoir pump. If your surgeon or anesthesiologist selects this option as a part of your pain control, one or more thin, soft tubes will be inserted into your incision site(s) at the end of surgery. These tubes will be connected to a device that is filled with a non-narcotic pain medicine. This medicine gradually empties into your incision site over the next several days. Usually, after all of the medicine is used, your health care provider will remove the tubes and throw away the device.  Opioids  · Moderate to moderately severe acute pain after surgery may respond to opioids. Opioids are narcotic pain medicine. Opioids are often combined with non-narcotic medicines to improve pain relief, lower the risk of side effects, and reduce the chance of addiction.  · If you follow your health care provider's directions about taking opioids and you do not have a history of substance abuse, your risk of becoming addicted is very small. To prevent addiction, opioids are given for short periods of time in careful doses.  Other Methods of Pain Control  · Steroids.  · Physical therapy.  · Heat and cold therapy.  · Compression, such as wrapping an elastic bandage around the area of the pain.  · Massage.     This information is not intended to replace advice given to you by your health care provider. Make sure you discuss any questions you have with your health care provider.     Document Released: 02/17/2003 Document Revised: 12/18/2014 Document Reviewed: 02/21/2011  Elsevier Interactive Patient Education ©2016 [employer]

## 2015-11-24 NOTE — ED Notes (Addendum)
Pt presents from home via POV with c/o right groin pain s/p inguinal hernia repair yesterday.  He was unable to fill his oxycodone prescription because the pharmacy stated it was written in two different inks and was not valid.  Pt took Advil overnight, but is in obvious pain today. Ice pack supplied to patient.

## 2015-11-27 DIAGNOSIS — G8929 Other chronic pain: Secondary | ICD-10-CM | POA: Diagnosis not present

## 2015-11-27 DIAGNOSIS — G8918 Other acute postprocedural pain: Secondary | ICD-10-CM | POA: Insufficient documentation

## 2015-11-27 DIAGNOSIS — F17200 Nicotine dependence, unspecified, uncomplicated: Secondary | ICD-10-CM | POA: Diagnosis not present

## 2015-11-27 DIAGNOSIS — Z8679 Personal history of other diseases of the circulatory system: Secondary | ICD-10-CM | POA: Insufficient documentation

## 2015-11-27 NOTE — ED Notes (Addendum)
Pt st's he had a hernia repair on 12/13 to right groin  St's area has now became swollen and painful.

## 2015-11-28 ENCOUNTER — Emergency Department (HOSPITAL_COMMUNITY): Payer: PRIVATE HEALTH INSURANCE

## 2015-11-28 ENCOUNTER — Encounter (HOSPITAL_COMMUNITY): Payer: Self-pay | Admitting: Radiology

## 2015-11-28 ENCOUNTER — Emergency Department (HOSPITAL_COMMUNITY)
Admission: EM | Admit: 2015-11-28 | Discharge: 2015-11-28 | Disposition: A | Discharge status: Home / Self Care | Payer: PRIVATE HEALTH INSURANCE | Attending: Emergency Medicine | Admitting: Emergency Medicine

## 2015-11-28 DIAGNOSIS — G8918 Other acute postprocedural pain: Secondary | ICD-10-CM

## 2015-11-28 LAB — CBC WITH DIFFERENTIAL/PLATELET
Basophils Absolute: 0 10*3/uL (ref 0.0–0.1)
Basophils Relative: 0 %
EOS ABS: 0.1 10*3/uL (ref 0.0–0.7)
Eosinophils Relative: 1 %
HEMATOCRIT: 36.7 % — AB (ref 39.0–52.0)
HEMOGLOBIN: 12.1 g/dL — AB (ref 13.0–17.0)
LYMPHS ABS: 1.2 10*3/uL (ref 0.7–4.0)
LYMPHS PCT: 12 %
MCH: 29.5 pg (ref 26.0–34.0)
MCHC: 33 g/dL (ref 30.0–36.0)
MCV: 89.5 fL (ref 78.0–100.0)
MONOS PCT: 9 %
Monocytes Absolute: 0.8 10*3/uL (ref 0.1–1.0)
NEUTROS PCT: 78 %
Neutro Abs: 7.6 10*3/uL (ref 1.7–7.7)
Platelets: 226 10*3/uL (ref 150–400)
RBC: 4.1 MIL/uL — ABNORMAL LOW (ref 4.22–5.81)
RDW: 15 % (ref 11.5–15.5)
WBC: 9.7 10*3/uL (ref 4.0–10.5)

## 2015-11-28 LAB — COMPREHENSIVE METABOLIC PANEL
ALK PHOS: 58 U/L (ref 38–126)
ALT: 17 U/L (ref 17–63)
ANION GAP: 9 (ref 5–15)
AST: 25 U/L (ref 15–41)
Albumin: 3.8 g/dL (ref 3.5–5.0)
BILIRUBIN TOTAL: 1 mg/dL (ref 0.3–1.2)
BUN: 11 mg/dL (ref 6–20)
CALCIUM: 9.1 mg/dL (ref 8.9–10.3)
CO2: 26 mmol/L (ref 22–32)
Chloride: 99 mmol/L — ABNORMAL LOW (ref 101–111)
Creatinine, Ser: 0.9 mg/dL (ref 0.61–1.24)
Glucose, Bld: 102 mg/dL — ABNORMAL HIGH (ref 65–99)
Potassium: 3.5 mmol/L (ref 3.5–5.1)
SODIUM: 134 mmol/L — AB (ref 135–145)
TOTAL PROTEIN: 7.7 g/dL (ref 6.5–8.1)

## 2015-11-28 MED ORDER — HYDROMORPHONE HCL 1 MG/ML IJ SOLN
1.0000 mg | Freq: Once | INTRAMUSCULAR | Status: AC
  Administered 2015-11-28: 1 mg via INTRAVENOUS
  Filled 2015-11-28: qty 1

## 2015-11-28 MED ORDER — IOHEXOL 300 MG/ML  SOLN
100.0000 mL | Freq: Once | INTRAMUSCULAR | Status: AC | PRN
  Administered 2015-11-28: 100 mL via INTRAVENOUS

## 2015-11-28 MED ORDER — ONDANSETRON HCL 4 MG/2ML IJ SOLN
4.0000 mg | Freq: Once | INTRAMUSCULAR | Status: AC
  Administered 2015-11-28: 4 mg via INTRAVENOUS
  Filled 2015-11-28: qty 2

## 2015-11-28 MED ORDER — ETODOLAC 400 MG PO TABS: 400.0000 mg | ORAL_TABLET | Freq: Two times a day (BID) | ORAL | Status: DC | PRN

## 2015-11-28 MED ORDER — KETOROLAC TROMETHAMINE 30 MG/ML IJ SOLN
30.0000 mg | Freq: Once | INTRAMUSCULAR | Status: AC
  Administered 2015-11-28: 30 mg via INTRAVENOUS
  Filled 2015-11-28: qty 1

## 2015-11-28 MED ORDER — SODIUM CHLORIDE 0.9 % IV BOLUS (SEPSIS)
500.0000 mL | Freq: Once | INTRAVENOUS | Status: AC
  Administered 2015-11-28: 500 mL via INTRAVENOUS

## 2015-11-28 NOTE — Discharge Instructions (Signed)
Pain Relief Preoperatively and Postoperatively  If you have questions, problems, or concerns about the pain that you may feel after surgery, let your health care provider know. Patients have the right to assessment and management of pain. Severe pain after surgery--and the fear or anxiety associated with that pain--may cause extreme discomfort that:  · Prevents sleep.  · Decreases the ability to breathe deeply and to cough. This can result in pneumonia or other upper airway infections.  · Causes the heart to beat more quickly and the blood pressure to be higher.  · Increases the risk for constipation and bloating.  · Decreases the ability of wounds to heal.  · May result in depression, increased anxiety, and feelings of helplessness.  Relieving pain before surgery (preoperatively) is also important because it lessens pain that you have after surgery (postoperatively). Patients who receive pain relief both before and after surgery experience greater pain relief than those who receive pain relief only after surgery. Let your health care provider know if you are having uncontrolled pain. This is very important. Pain after surgery is more difficult to manage if it is severe, so receiving prompt and adequate treatment of acute pain is necessary. If you become constipated after taking pain medicine, drink more liquids if you can. Your health care provider may have you take a mild laxative.  PAIN CONTROL METHODS  Your health care providers follow policies and procedures about the management of your pain. These guidelines should be explained to you before surgery. Plans for pain control after surgery must be decided upon by you and your health care provider and put into use with your full understanding and agreement. Do not be afraid to ask questions about the care that you are receiving.  Your health care providers will attempt to control your pain in various ways, and these methods may be used together (multimodal  analgesia). Using this approach has many benefits for you, including being able to eat, move around, and leave the hospital sooner.  As-Needed Pain Control  · You may be given pain medicine through an IV tube or as a pill or liquid that you can swallow. Let your health care provider know when you are having pain, and he or she will give you the pain medicine that is ordered for you.  IV Patient-Controlled Analgesia (PCA) Pump  · You can receive your pain medicine through an IV tube that goes into one of your veins. You can control the amount of pain medicine that you get. The pain medicine is controlled by a pump. When you push the button that is hooked up to this pump, you receive a specific amount of pain medicine. This button should be pushed only by you or by someone who is specifically assigned by you to do so. It is set up to keep you from accidentally giving yourself too much pain medicine. You will be able to start using your pain pump in the recovery room after your surgery. This method can be helpful for most types of surgery.  · Tell your health care provider:    If you are having too much pain.    If you are feeling too sleepy or nauseous.  Continuous Epidural Pain Control  · A thin, soft tube (catheter) is put into your back, outside the outer layer of your spinal cord. Pain medicine flows through the catheter to lessen pain in areas of your body that are below the level of catheter placement. Continuous epidural pain control may work best for you   if you are having surgery on your abdomen, hip area, or legs. The epidural catheter is usually put into your back shortly before surgery. It is left in until you can eat, take medicine by mouth, pass urine, and have a bowel movement.  · Giving pain medicine through the epidural catheter may help you to heal more quickly because you can do these things sooner:    Regain normal bowel and bladder function.    Return to eating.    Get up and walk.  Medicine That  Numbs the Area (Local Anesthetic)  You may be given pain medicine:  · As an injection near the area of the pain (local infiltration).  · As an injection near the nerve that controls the sensation to a specific part of your body (peripheral nerve block).  · In your spine to block pain (spinal block).  · Through a local anesthetic reservoir pump. If your surgeon or anesthesiologist selects this option as a part of your pain control, one or more thin, soft tubes will be inserted into your incision site(s) at the end of surgery. These tubes will be connected to a device that is filled with a non-narcotic pain medicine. This medicine gradually empties into your incision site over the next several days. Usually, after all of the medicine is used, your health care provider will remove the tubes and throw away the device.  Opioids  · Moderate to moderately severe acute pain after surgery may respond to opioids. Opioids are narcotic pain medicine. Opioids are often combined with non-narcotic medicines to improve pain relief, lower the risk of side effects, and reduce the chance of addiction.  · If you follow your health care provider's directions about taking opioids and you do not have a history of substance abuse, your risk of becoming addicted is very small. To prevent addiction, opioids are given for short periods of time in careful doses.  Other Methods of Pain Control  · Steroids.  · Physical therapy.  · Heat and cold therapy.  · Compression, such as wrapping an elastic bandage around the area of the pain.  · Massage.     This information is not intended to replace advice given to you by your health care provider. Make sure you discuss any questions you have with your health care provider.     Document Released: 02/17/2003 Document Revised: 12/18/2014 Document Reviewed: 02/21/2011  Elsevier Interactive Patient Education ©2016 [employer]

## 2015-11-28 NOTE — ED Notes (Signed)
Witnessed scrotal exam by Dr. Blinda LeatherwoodPollina.

## 2015-11-28 NOTE — ED Provider Notes (Signed)
CSN: [id number]     Arrival date & time 11/27/15  2341 History   By signing my name below, I, Arlan Organ, attest that this documentation has been prepared under the direction and in the presence of Gilda Crease, MD.  Electronically Signed: Arlan Organ, ED Scribe. 11/28/2015. 1:42 AM.   Chief Complaint  Patient presents with  . Post-op Problem   The history is provided by the patient. No language interpreter was used.    HPI Comments: Sergio Boyd is a 44 y.o. male without any pertinent past medical history who presents to the Emergency Department here for a post-op problem this evening. Pt reports ongoing, worsening swelling and pain to the R groin. Mr. Wedig underwent surgery 4 days ago to repair an inguinal hernia but states "it doesn't even feel like i had surgery". Discomfort is exacerbated with certain movements and positioning. No alleviating factors at this time. No OTC/prescribed medications or home remedies attempted prior to arrival. He denies any reported issues during surgery. Pt denies any recent fever, chills, nausea, or vomiting.  PCP: Ernst Breach, PA-C    Past Medical History  Diagnosis Date  . History of migraine     last one a month ago  . Joint pain   . Chronic back pain     herniated disc   Past Surgical History  Procedure Laterality Date  . Knee surgery      right ACL 2012  . Inguinal hernia repair Right   . Inguinal hernia repair Right 11/23/2015    Procedure: RIGHT INGUINAL HERNIA REPAIR W/MESH;  Surgeon: Avel Peace, MD;  Location: [facility] OR;  Service: General;  Laterality: Right;  . Insertion of mesh Right 11/23/2015    Procedure: INSERTION OF MESH;  Surgeon: Avel Peace, MD;  Location: [facility] OR;  Service: General;  Laterality: Right;   No family history on file. Social History  Substance Use Topics  . Smoking status: Current Every Day Smoker -- 0.25 packs/day for 10 years  . Smokeless tobacco: None  . Alcohol Use: Yes     Comment:  socially     Review of Systems  Constitutional: Negative for fever and chills.  Respiratory: Negative for cough and shortness of breath.   Cardiovascular: Negative for chest pain.  Gastrointestinal: Negative for nausea, vomiting, abdominal pain and diarrhea.  Genitourinary:       Swelling and pain to the R groin  Musculoskeletal: Negative for back pain.  Neurological: Negative for headaches.  Psychiatric/Behavioral: Negative for confusion.  All other systems reviewed and are negative.     Allergies  Tylenol  Home Medications   Prior to Admission medications   Medication Sig Start Date End Date Taking? Authorizing Provider  Naproxen Sodium (ALEVE) 220 MG CAPS Take 220 mg by mouth 2 (two) times daily as needed (pain).   Yes Historical Provider, MD  oxyCODONE (OXY IR/ROXICODONE) 5 MG immediate release tablet Take 1-2 tablets (5-10 mg total) by mouth every 4 (four) hours as needed for moderate pain, severe pain or breakthrough pain. 11/24/15  Yes Roxy Horseman, PA-C  Phenyleph-Doxylamine-DM-APAP (ALKA SELTZER PLUS PO) Take 1 capsule by mouth 3 (three) times daily as needed (cold symptoms).   Yes Historical Provider, MD  Tetrahydrozoline HCl (VISINE OP) Apply 1-2 drops to eye daily as needed (dry eyes).   Yes Historical Provider, MD   Triage Vitals: BP 127/75 mmHg  Pulse 76  Temp(Src) 98.3 F (36.8 C)  Resp 18  Ht  (1.778 m)  Wt 220 lb (99.791 kg)  BMI 31.57 kg/m2  SpO2 94%   Physical Exam  Constitutional: He is oriented to person, place, and time. He appears well-developed and well-nourished. No distress.  HENT:  Head: Normocephalic and atraumatic.  Right Ear: Hearing normal.  Left Ear: Hearing normal.  Nose: Nose normal.  Mouth/Throat: Oropharynx is clear and moist and mucous membranes are normal.  Eyes: Conjunctivae and EOM are normal. Pupils are equal, round, and reactive to light.  Neck: Normal range of motion. Neck supple.  Cardiovascular: Regular rhythm, S1  normal and S2 normal.  Exam reveals no gallop and no friction rub.   No murmur heard. Pulmonary/Chest: Effort normal and breath sounds normal. No respiratory distress. He exhibits no tenderness.  Abdominal: Soft. Normal appearance and bowel sounds are normal. There is no hepatosplenomegaly. There is no tenderness. There is no rebound, no guarding, no tenderness at McBurney's point and negative Murphy's sign. No hernia.  Genitourinary:  Large tender R inguinal mass  Musculoskeletal: Normal range of motion.  Neurological: He is alert and oriented to person, place, and time. He has normal strength. No cranial nerve deficit or sensory deficit. Coordination normal. GCS eye subscore is 4. GCS verbal subscore is 5. GCS motor subscore is 6.  Skin: Skin is warm, dry and intact. No rash noted. No cyanosis.  Psychiatric: He has a normal mood and affect. His speech is normal and behavior is normal. Thought content normal.  Nursing note and vitals reviewed.   ED Course  Procedures (including critical care time)  DIAGNOSTIC STUDIES: Oxygen Saturation is 97% on RA, adequate by my interpretation.    COORDINATION OF CARE: 1:17 AM- Will give fluids, Zofran, and Dilaudid. Will order CT abdomen pelvis with contrast, CBC, and CMP. Discussed treatment plan with pt at bedside and pt agreed to plan.     Labs Review Labs Reviewed  CBC WITH DIFFERENTIAL/PLATELET - Abnormal; Notable for the following:    RBC 4.10 (*)    Hemoglobin 12.1 (*)    HCT 36.7 (*)    All other components within normal limits  COMPREHENSIVE METABOLIC PANEL - Abnormal; Notable for the following:    Sodium 134 (*)    Chloride 99 (*)    Glucose, Bld 102 (*)    All other components within normal limits    Imaging Review Ct Abdomen Pelvis W Contrast  11/28/2015  CLINICAL DATA:  Right inguinal pain and swelling, recent inguinal hernia surgery EXAM: CT ABDOMEN AND PELVIS WITH CONTRAST TECHNIQUE: Multidetector CT imaging of the abdomen  and pelvis was performed using the standard protocol following bolus administration of intravenous contrast. CONTRAST:  100mL OMNIPAQUE IOHEXOL 300 MG/ML  SOLN COMPARISON:  None. FINDINGS: Lung bases are free of acute infiltrate or sizable effusion. The liver, gallbladder, spleen, adrenal glands and pancreas are within normal limits with the exception of a small cyst of the left lobe of the liver. The kidneys are well visualized bilaterally without calculi or obstructive changes. The appendix is within normal limits. The bladder is partially distended. No pelvic mass lesion is seen. There are changes consistent with the patient's given clinical history of recent right inguinal hernia surgery. Inflammatory changes are noted along the inguinal canal on the right with scrotal swelling on the right. Some venous engorgement is noted. This is of uncertain significance and likely related to localized inflammatory change. Air is noted within the inguinal canal consistent with the recent surgery. No bony abnormality is noted. No recurrent hernia is noted.  IMPRESSION: Changes consistent with the recent inguinal hernia surgery on the right. Some inflammatory changes are noted without definitive abscess formation. Changes suggestive of venous engorgement are noted within the inguinal canal and extending into the scrotum on the right. Electronically Signed   By: Alcide Clever M.D.   On: 11/28/2015 07:10   I have personally reviewed and evaluated these images and lab results as part of my medical decision-making.   EKG Interpretation None      MDM   Final diagnoses:  Post-operative pain    Patient is 4 days post right inguinal hernia repair. Patient has a large swollen mass in the right inguinal region. CT scan shows postoperative changes, no recurrence of hernia. Patient has been seen by Dr. Sheliah Hatch here in the ER, does not feel there is any concern for infection. Patient will be discharged. Patient clearly  altered his initial prescription for OxyIR. He was given an additional prescription at his most recent visit to the ER. I will not give him any additional narcotic analgesia at time of discharge.  I personally performed the services described in this documentation, which was scribed in my presence. The recorded information has been reviewed and is accurate.    Gilda Crease, MD 11/28/15 [phone]

## 2015-11-28 NOTE — ED Notes (Signed)
Called CT regarding patient's scan - scanner is being cleaned from trauma and then patient will go.

## 2015-11-28 NOTE — ED Notes (Signed)
Consulting MD at bedside

## 2015-11-28 NOTE — Consult Note (Signed)
11/28/2015  Sergio Boyd [id number] [birth year]  CC: right scrotal swelling after hernia repair  Subjective: 44 yo male s/p R open inguinal hernia repair who comes in to the ER today with worsening swelling of groin. He has had a bad problem with pain and has been unable to lie flat and has been sleeping in a recliner. He has been using ice to his groin for the majority of the day but is concerned that it continues to get worse. He is taking oxycodone for the pain with minimal relief and was in the ER 3 days ago for pain complaints.  There are no active problems to display for this patient.  Past Medical History  Diagnosis Date  . History of migraine     last one a month ago  . Joint pain   . Chronic back pain     herniated disc    Past Surgical History  Procedure Laterality Date  . Knee surgery      right ACL 2012  . Inguinal hernia repair Right   . Inguinal hernia repair Right 11/23/2015    Procedure: RIGHT INGUINAL HERNIA REPAIR W/MESH;  Surgeon: Avel Peaceodd Rosenbower, MD;  [facility];  Service: General;  Laterality: Right;  . Insertion of mesh Right 11/23/2015    Procedure: INSERTION OF MESH;  Surgeon: Avel Peaceodd Rosenbower, MD;  [facility];  Service: General;  Laterality: Right;     (Not in a hospital admission) Allergies  Allergen Reactions  . Tylenol [Acetaminophen] Nausea And Vomiting    Throat starts itching     Social History  Substance Use Topics  . Smoking status: Current Every Day Smoker -- 0.25 packs/day for 10 years  . Smokeless tobacco: Not on file  . Alcohol Use: Yes     Comment: socially     No family history on file.   Review of Systems A comprehensive 12 point review of systems was negative except for: pain and swelling of groin  Objective: Vital signs in last 24 hours: Temp:  [98.3 F (36.8 C)] 98.3 F (36.8 C) (12/17 2347) Pulse Rate:  [76-97] 76 (12/18 0700) Resp:  [18-20] 18 (12/18 0658) BP: (122-134)/(73-94) 127/75 mmHg (12/18 0700) SpO2:   [94 %-97 %] 94 % (12/18 0700) Weight:  [99.791 kg (220 lb)] 99.791 kg (220 lb) (12/17 2347)  Focal exam: wound c/d/i, no swelling around wound. Scrotal with edema and fullness.  Data Review: WBC 9.7, HGB 12.1  CT scan reviewed  Assessment/Plan: 44 yo male s/p right hernia repair. CT shows good resolution of hernia and some edema around the canal and scrotum. I do not think this is infectious in nature. I suggested changing his sleep position to keep the scrotum from being the gravity dependent area of his body by sleeping flat or on his side and elevate his scrotum with a pillow, etc. I recommended to continue to ice the groin and to add ibuprofen to help with this post op muscular type pain. -follow up with Rosenbower at scheduled appointment

## 2015-11-30 ENCOUNTER — Encounter (HOSPITAL_COMMUNITY): Payer: Self-pay | Admitting: General Surgery

## 2016-01-11 ENCOUNTER — Telehealth: Payer: Self-pay | Admitting: Medical

## 2016-01-11 NOTE — Telephone Encounter (Signed)
Pt called and stated that he would like to see a different surgeon. The one that did his surgery has released him to go back to work. He doesn't feel that he is ready and is still in pain. He would like a second opinion concerning returning to work. Please call pt at [phone].

## 2016-01-12 NOTE — Telephone Encounter (Signed)
Its been over a month since surgery.  If still in pain, I would call the surgeon's office or schedule f/u with them to be firm that he is still having quite a bit of pain, or if needed come in here to evaluate.  I would expect some tenderness ongoing for a while but not moderate to severe pain at this point.

## 2016-01-12 NOTE — Telephone Encounter (Signed)
Left message with pt to inform him of Shane's advise. Informed pt that he would like to be reevaluated by Vincenza Hews to call for an appt.

## 2016-01-17 ENCOUNTER — Institutional Professional Consult (permissible substitution): Payer: PRIVATE HEALTH INSURANCE | Admitting: Medical

## 2016-12-05 ENCOUNTER — Telehealth: Payer: Self-pay | Admitting: Medical

## 2016-12-05 ENCOUNTER — Ambulatory Visit (INDEPENDENT_AMBULATORY_CARE_PROVIDER_SITE_OTHER): Payer: BLUE CROSS/BLUE SHIELD | Admitting: Family Medicine

## 2016-12-05 ENCOUNTER — Encounter: Payer: Self-pay | Admitting: Family Medicine

## 2016-12-05 ENCOUNTER — Ambulatory Visit
Admission: RE | Admit: 2016-12-05 | Discharge: 2016-12-05 | Disposition: A | Discharge status: Home / Self Care | Payer: PRIVATE HEALTH INSURANCE | Source: Ambulatory Visit | Attending: Family Medicine | Admitting: Family Medicine

## 2016-12-05 VITALS — BP 120/70 | HR 74 | Wt 210.0 lb

## 2016-12-05 DIAGNOSIS — G8929 Other chronic pain: Secondary | ICD-10-CM

## 2016-12-05 DIAGNOSIS — M545 Low back pain: Secondary | ICD-10-CM | POA: Diagnosis not present

## 2016-12-05 DIAGNOSIS — M542 Cervicalgia: Secondary | ICD-10-CM | POA: Diagnosis not present

## 2016-12-05 DIAGNOSIS — R1031 Right lower quadrant pain: Secondary | ICD-10-CM

## 2016-12-05 NOTE — Progress Notes (Signed)
   Subjective:    Patient ID: Sergio Boyd, male    DOB: 26-Feb-1971, 45 y.o.   MRN: [id number]  HPI He is here for evaluation of multiple issues. Complains of a two-week history of left-sided neck and upper shoulder pain. Is made worse with coughing and also when he turns his head to the left. No radiation down his arm. He has been using heat intermittently and did take 2 Aleve but did not continue with this. He is not taken any other medications or sought care from anyone else. Also he did have right inguinal hernia repair in December of last year. He has had difficulty with intermittent right lower quadrant pain since that time. He states that motion makes this worse. He also notes a when he lays on his abdomen he will feel discomfort in that area. He did not get follow-up with his surgeon stating he had difficulties with communication mainly revolving around the nurse and information given concerning returning to work activities. He also has a history of low back pain dating back to 2012. Apparently he did have MRI done which did show evidence of herniated disc. He states that he was told that the options were surgery/injection/chiropractic. He elected to get chiropractic care. That apparently did give some relief. He has not seen the chiropractor in approximately 2 years. He complains of constant pain but he is continuing to work.   Review of Systems     Objective:   Physical Exam Alert and in no distress. Pain on motion of the neck especially with left-sided rotation. Normal motor, sensory and DTRs in the arms. DTRs the lower extremities were normal. Negative straight leg raising. Abdominal exam does show slight tenderness in the area of the incision and palpable mesh superior to that.       Assessment & Plan:  Neck pain on left side - Plan: DG Cervical Spine Complete  Right lower quadrant abdominal pain - Plan: Ambulatory referral to General Surgery  Chronic low back pain, unspecified back  pain laterality, with sciatica presence unspecified - Plan: Ambulatory referral to Orthopedic Surgery I discussed the neck pain. The x-ray showed minor changes. I explained that we could send physical therapy or he can see his chiropractor. He will let me know if his choices. I will also refer him back to Gen. surgery for further evaluation of continued lower abdominal pain. He will also need follow-up with his surgeon concerning his back pain.

## 2016-12-05 NOTE — Telephone Encounter (Signed)
Patient called about Ortho referral and he does [facility] or [facility]

## 2016-12-06 ENCOUNTER — Ambulatory Visit: Payer: PRIVATE HEALTH INSURANCE | Admitting: Medical

## 2016-12-06 NOTE — Telephone Encounter (Signed)
I explained to pt yesterday that Dr.Beane is the one who saw him last and that Dr.Lalonde [facility] so I guess I will refer [facility] to [facility]

## 2016-12-07 DIAGNOSIS — M531 Cervicobrachial syndrome: Secondary | ICD-10-CM | POA: Diagnosis not present

## 2016-12-07 DIAGNOSIS — M9902 Segmental and somatic dysfunction of thoracic region: Secondary | ICD-10-CM | POA: Diagnosis not present

## 2016-12-07 DIAGNOSIS — M9901 Segmental and somatic dysfunction of cervical region: Secondary | ICD-10-CM | POA: Diagnosis not present

## 2016-12-07 DIAGNOSIS — M9903 Segmental and somatic dysfunction of lumbar region: Secondary | ICD-10-CM | POA: Diagnosis not present

## 2016-12-13 DIAGNOSIS — M531 Cervicobrachial syndrome: Secondary | ICD-10-CM | POA: Diagnosis not present

## 2016-12-13 DIAGNOSIS — M9901 Segmental and somatic dysfunction of cervical region: Secondary | ICD-10-CM | POA: Diagnosis not present

## 2016-12-13 DIAGNOSIS — M9903 Segmental and somatic dysfunction of lumbar region: Secondary | ICD-10-CM | POA: Diagnosis not present

## 2016-12-13 DIAGNOSIS — M9902 Segmental and somatic dysfunction of thoracic region: Secondary | ICD-10-CM | POA: Diagnosis not present

## 2017-02-08 HISTORY — PX: LUMBAR SPINE SURGERY: SHX701

## 2017-02-20 ENCOUNTER — Encounter: Payer: Self-pay | Admitting: Medical

## 2017-02-20 ENCOUNTER — Ambulatory Visit (INDEPENDENT_AMBULATORY_CARE_PROVIDER_SITE_OTHER): Payer: BLUE CROSS/BLUE SHIELD | Admitting: Medical

## 2017-02-20 VITALS — BP 122/84 | HR 61 | Wt 210.8 lb

## 2017-02-20 DIAGNOSIS — R29898 Other symptoms and signs involving the musculoskeletal system: Secondary | ICD-10-CM | POA: Diagnosis not present

## 2017-02-20 DIAGNOSIS — M5410 Radiculopathy, site unspecified: Secondary | ICD-10-CM

## 2017-02-20 DIAGNOSIS — G8929 Other chronic pain: Secondary | ICD-10-CM | POA: Diagnosis not present

## 2017-02-20 DIAGNOSIS — R7309 Other abnormal glucose: Secondary | ICD-10-CM

## 2017-02-20 DIAGNOSIS — R202 Paresthesia of skin: Secondary | ICD-10-CM | POA: Diagnosis not present

## 2017-02-20 DIAGNOSIS — M5440 Lumbago with sciatica, unspecified side: Secondary | ICD-10-CM | POA: Diagnosis not present

## 2017-02-20 LAB — POCT GLYCOSYLATED HEMOGLOBIN (HGB A1C): HEMOGLOBIN A1C: 5.6

## 2017-02-20 MED ORDER — OXYCODONE HCL 5 MG PO TABS: 5.0000 mg | ORAL_TABLET | Freq: Four times a day (QID) | ORAL | 0 refills | Status: DC | PRN

## 2017-02-20 MED ORDER — PREDNISONE 10 MG PO TABS: ORAL_TABLET | ORAL | 0 refills | Status: DC

## 2017-02-20 NOTE — Progress Notes (Signed)
Subjective: Chief Complaint  Patient presents with  . back pain    back pain going into his legs    Here for chronic back pain but worse in last few weeks.   denies recent injury, fall, or trauma.  Has pain that radiates into legs, sometimes in the left, but worse typically in the right.   Currently has constant pain in low back and right let.  Hurts to walk and sleep, but sitting not quite as bad.  Coughing, laughing, everything seems to aggravate the pain.  Has gotten unbearable.     Sometimes gets tingling and sometimes numbness from buttocks to feet, mostly right, but can be left leg too.     Using ibuprofen, but this doesn't help.    Pain is all day 10/10 or actually 11/10.  Been at this level in recent weeks.  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  Does stand all day 10-12 hours per day.    was seeing chiropractor in the past 1.5 months since his last visit here with Dr. Redmond School  He wants to be screened for diabetes. He has no worrisome symptoms.  Past Medical History:  Diagnosis Date  . Chronic back pain    herniated disc  . History of migraine    last one a month ago  . Joint pain    No current outpatient prescriptions on file prior to visit.   No current facility-administered medications on file prior to visit.    ROS as in subjective   Objective: BP 122/84   Pulse 61   Wt 210 lb 12.8 oz (95.6 kg)   SpO2 98%   BMI 30.25 kg/m    General appearance: alert, no distress, WD/WN, AA male, in pain Abdomen: +bs, soft, non tender, non distended, no masses, no hepatomegaly, no splenomegaly Back: tender lumbar region, worse on right, tender midline, ROM quite limited due to pain in general Musculoskeletal: legs nontender, no swelling, no obvious deformity, right hip with mild pain with ROM.  Extremities: no edema, no cyanosis, no clubbing Pulses: 2+ symmetric, upper and lower extremities, normal cap  refill Neurological: limited heel raise and toe walk, +right leg SLR at 20 degrees, right leg strength in general seems 4-5/5 , otherwise DTRs 1+ of legs, rest of legs neuro exam unremarkable   Assessment: Encounter Diagnoses  Name Primary?  . Chronic bilateral low back pain with sciatica, sciatica laterality unspecified Yes  . Radicular pain   . Weakness of right leg   . Paresthesia   . Elevated glucose      Plan: Given the severeity of his pain, his exam findings, will try and pursue MRI L spine.   He has right leg weakness, is moving slow, notes severe pain, has paresthesias and radicular symptoms.   For now , use medications below.   Wrote out of work today and tomorrow, then gave work restrictions for the next week.  F/u pending MRI  discussed his HgbA1C today.   Is borderline but not diabetic.  Counseled on diet, exercise, f/u.  Emersen was seen today for back pain.  Diagnoses and all orders for this visit:  Chronic bilateral low back pain with sciatica, sciatica laterality unspecified -     MR Lumbar Spine Wo Contrast; Future  Radicular pain -     MR Lumbar Spine Wo Contrast; Future  Weakness of right leg -     MR Lumbar Spine Wo Contrast; Future  Paresthesia -     MR Lumbar Spine Wo Contrast; Future  Elevated glucose -     HgB A1c  Other orders -     oxyCODONE (OXY IR/ROXICODONE) 5 MG immediate release tablet; Take 1-2 tablets (5-10 mg total) by mouth every 6 (six) hours as needed for moderate pain, severe pain or breakthrough pain. -     predniSONE (DELTASONE) 10 MG tablet; 6/5/4/3/2/1

## 2017-02-21 DIAGNOSIS — M9903 Segmental and somatic dysfunction of lumbar region: Secondary | ICD-10-CM | POA: Diagnosis not present

## 2017-02-21 DIAGNOSIS — M9901 Segmental and somatic dysfunction of cervical region: Secondary | ICD-10-CM | POA: Diagnosis not present

## 2017-02-21 DIAGNOSIS — M5417 Radiculopathy, lumbosacral region: Secondary | ICD-10-CM | POA: Diagnosis not present

## 2017-02-21 DIAGNOSIS — M9902 Segmental and somatic dysfunction of thoracic region: Secondary | ICD-10-CM | POA: Diagnosis not present

## 2017-02-22 DIAGNOSIS — M5417 Radiculopathy, lumbosacral region: Secondary | ICD-10-CM | POA: Diagnosis not present

## 2017-02-22 DIAGNOSIS — M9902 Segmental and somatic dysfunction of thoracic region: Secondary | ICD-10-CM | POA: Diagnosis not present

## 2017-02-22 DIAGNOSIS — M9901 Segmental and somatic dysfunction of cervical region: Secondary | ICD-10-CM | POA: Diagnosis not present

## 2017-02-22 DIAGNOSIS — M9903 Segmental and somatic dysfunction of lumbar region: Secondary | ICD-10-CM | POA: Diagnosis not present

## 2017-02-26 ENCOUNTER — Telehealth: Payer: Self-pay

## 2017-02-26 ENCOUNTER — Ambulatory Visit
Admission: RE | Admit: 2017-02-26 | Discharge: 2017-02-26 | Disposition: A | Discharge status: Home / Self Care | Payer: BLUE CROSS/BLUE SHIELD | Source: Ambulatory Visit | Attending: Medical | Admitting: Medical

## 2017-02-26 DIAGNOSIS — M5417 Radiculopathy, lumbosacral region: Secondary | ICD-10-CM | POA: Diagnosis not present

## 2017-02-26 DIAGNOSIS — M5410 Radiculopathy, site unspecified: Secondary | ICD-10-CM

## 2017-02-26 DIAGNOSIS — R29898 Other symptoms and signs involving the musculoskeletal system: Secondary | ICD-10-CM

## 2017-02-26 DIAGNOSIS — R202 Paresthesia of skin: Secondary | ICD-10-CM

## 2017-02-26 DIAGNOSIS — M9901 Segmental and somatic dysfunction of cervical region: Secondary | ICD-10-CM | POA: Diagnosis not present

## 2017-02-26 DIAGNOSIS — M9902 Segmental and somatic dysfunction of thoracic region: Secondary | ICD-10-CM | POA: Diagnosis not present

## 2017-02-26 DIAGNOSIS — M5440 Lumbago with sciatica, unspecified side: Principal | ICD-10-CM

## 2017-02-26 DIAGNOSIS — M9903 Segmental and somatic dysfunction of lumbar region: Secondary | ICD-10-CM | POA: Diagnosis not present

## 2017-02-26 DIAGNOSIS — G8929 Other chronic pain: Secondary | ICD-10-CM

## 2017-02-26 DIAGNOSIS — M48061 Spinal stenosis, lumbar region without neurogenic claudication: Secondary | ICD-10-CM | POA: Diagnosis not present

## 2017-02-26 NOTE — Telephone Encounter (Signed)
That is fine 

## 2017-02-26 NOTE — Telephone Encounter (Signed)
Called l/m to let him know that I faxed referral to neurosurgery to dr.stern

## 2017-02-26 NOTE — Telephone Encounter (Signed)
Pt called back and said that he didn't want to see an ortho. And wants to [facility] . Is this okay ?

## 2017-02-27 ENCOUNTER — Other Ambulatory Visit: Payer: Self-pay | Admitting: Medical

## 2017-02-27 ENCOUNTER — Telehealth: Payer: Self-pay

## 2017-02-27 MED ORDER — OXYCODONE HCL 5 MG PO TABS: 5.0000 mg | ORAL_TABLET | Freq: Four times a day (QID) | ORAL | 0 refills | Status: DC | PRN

## 2017-02-27 NOTE — Telephone Encounter (Signed)
Spoke with pt to let him know that we can give him anything stronger and he can come by and pick up the refill. Also let him know that I called to [facility]  To get him  In this week.  Also let him know that if the pain gets any worst to go to the ed . He said that he will send his mom by to pick up the rx  Because he was at work.

## 2017-02-27 NOTE — Telephone Encounter (Signed)
I refilled the Oxycodone for short term.   Try and get him in with Dr. Venetia MaxonStern ASAP, this week if possible if that bad.    However, if he literally can't walk, or is having numbness in genitalia, incontinence, then go to the emergency dept.

## 2017-02-27 NOTE — Telephone Encounter (Signed)
Pt called and wants to know if he can have something else for pain, the meds that you gave him isn't working it not touching the pain . You  Gave him oxycodone 5 mg. He said that he can barely walk.

## 2017-02-27 NOTE — Telephone Encounter (Signed)
Called and l/m with [facility] dr.stern assistance to call me back about trying to get him in sooner, and called and l/m for patient to call us back about meds .

## 2017-02-28 DIAGNOSIS — M9901 Segmental and somatic dysfunction of cervical region: Secondary | ICD-10-CM | POA: Diagnosis not present

## 2017-02-28 DIAGNOSIS — M9902 Segmental and somatic dysfunction of thoracic region: Secondary | ICD-10-CM | POA: Diagnosis not present

## 2017-02-28 DIAGNOSIS — M5417 Radiculopathy, lumbosacral region: Secondary | ICD-10-CM | POA: Diagnosis not present

## 2017-02-28 DIAGNOSIS — M9903 Segmental and somatic dysfunction of lumbar region: Secondary | ICD-10-CM | POA: Diagnosis not present

## 2017-03-01 DIAGNOSIS — M5127 Other intervertebral disc displacement, lumbosacral region: Secondary | ICD-10-CM | POA: Diagnosis not present

## 2017-03-08 DIAGNOSIS — M5127 Other intervertebral disc displacement, lumbosacral region: Secondary | ICD-10-CM | POA: Diagnosis not present

## 2017-03-08 DIAGNOSIS — M5117 Intervertebral disc disorders with radiculopathy, lumbosacral region: Secondary | ICD-10-CM | POA: Diagnosis not present

## 2017-04-13 ENCOUNTER — Encounter (HOSPITAL_COMMUNITY): Payer: Self-pay

## 2017-04-13 DIAGNOSIS — K644 Residual hemorrhoidal skin tags: Secondary | ICD-10-CM | POA: Insufficient documentation

## 2017-04-13 DIAGNOSIS — F17200 Nicotine dependence, unspecified, uncomplicated: Secondary | ICD-10-CM | POA: Insufficient documentation

## 2017-04-13 DIAGNOSIS — K6289 Other specified diseases of anus and rectum: Secondary | ICD-10-CM | POA: Diagnosis present

## 2017-04-13 DIAGNOSIS — K649 Unspecified hemorrhoids: Secondary | ICD-10-CM | POA: Diagnosis not present

## 2017-04-13 DIAGNOSIS — Z79899 Other long term (current) drug therapy: Secondary | ICD-10-CM | POA: Diagnosis not present

## 2017-04-13 NOTE — ED Triage Notes (Signed)
Pt reports rectal pain that started two days ago, LBM this morning and was painful. He reports recent lower back surgery on March 29th. Denies rectal bleeding.

## 2017-04-14 ENCOUNTER — Emergency Department (HOSPITAL_COMMUNITY)
Admission: EM | Admit: 2017-04-14 | Discharge: 2017-04-14 | Disposition: A | Discharge status: Home / Self Care | Payer: BLUE CROSS/BLUE SHIELD | Attending: Emergency Medicine | Admitting: Emergency Medicine

## 2017-04-14 DIAGNOSIS — K649 Unspecified hemorrhoids: Secondary | ICD-10-CM

## 2017-04-14 MED ORDER — PSYLLIUM 28 % PO PACK: 1.0000 | PACK | Freq: Two times a day (BID) | ORAL | 0 refills | Status: DC

## 2017-04-14 MED ORDER — DIBUCAINE 1 % RE OINT: 1.0000 "application " | TOPICAL_OINTMENT | Freq: Three times a day (TID) | RECTAL | 0 refills | Status: DC | PRN

## 2017-04-14 MED ORDER — OXYCODONE-ACETAMINOPHEN 5-325 MG PO TABS
1.0000 | ORAL_TABLET | Freq: Once | ORAL | Status: AC
  Administered 2017-04-14: 1 via ORAL
  Filled 2017-04-14: qty 1

## 2017-04-14 NOTE — ED Provider Notes (Signed)
[facility]-EMERGENCY DEPT Provider Note   CSN: [id number] Arrival date & time: 04/13/17  1956     History   Chief Complaint Chief Complaint  Patient presents with  . Rectal Pain    HPI Ashante Snelling is a 46 y.o. male.  HPI   Patient is a 46 year old male with history of chronic back pain status post back surgery who presents to the ED with complaint of rectal pain. Patient reports having constant gradually worsening rectal pain over the past 3 days. He notes the pain is worse when sitting upright standing or having a bowel movement. He states he has been applying over-the-counter steroid cream without relief. Patient reports having sensation of feeling constipated but notes he has had multiple bowel movements over the past few days. Patient reports having chronic right lower abdominal pain that has been present for the past year after having surgical hernia repair and notes his abdominal pain is chronic and unchanged in nature. He also reports having chronic low back pain and notes he had surgery on his lumbar herniated disc a few weeks ago which has been healing without any complications. Denies fever, redness, swelling or drainage from surgical site Denies fever, chest pain, shortness of breath, nausea, vomiting, diarrhea, constipation, rectal bleeding, urinary symptoms, penile discharge, penile or testicular pain/swelling. Denies urinary or bowel incontinence, saddle anesthesia, numbness, weakness.  Past Medical History:  Diagnosis Date  . Chronic back pain    herniated disc  . History of migraine    last one a month ago  . Joint pain     There are no active problems to display for this patient.   Past Surgical History:  Procedure Laterality Date  . INGUINAL HERNIA REPAIR Right   . INGUINAL HERNIA REPAIR Right 11/23/2015   Procedure: RIGHT INGUINAL HERNIA REPAIR W/MESH;  Surgeon: Avel Peace, MD;  Location: [facility] OR;  Service: General;  Laterality: Right;  . INSERTION OF MESH Right  11/23/2015   Procedure: INSERTION OF MESH;  Surgeon: Avel Peace, MD;  Location: [facility] OR;  Service: General;  Laterality: Right;  . KNEE SURGERY     right ACL 2012       Home Medications    Prior to Admission medications   Medication Sig Start Date End Date Taking? Authorizing Provider  dibucaine (NUPERCAINAL) 1 % OINT Place 1 application rectally 3 (three) times daily as needed for pain. 04/14/17   Barrett Henle, PA-C  Ibuprofen (ADVIL PO) Take by mouth.    [provider]  oxyCODONE (OXY IR/ROXICODONE) 5 MG immediate release tablet Take 1-2 tablets (5-10 mg total) by mouth every 6 (six) hours as needed for moderate pain, severe pain or breakthrough pain. 02/27/17   Tysinger, Kermit Balo, PA-C  predniSONE (DELTASONE) 10 MG tablet 6/5/4/3/2/1 02/20/17   Tysinger, Kermit Balo, PA-C  psyllium (METAMUCIL SMOOTH TEXTURE) 28 % packet Take 1 packet by mouth 2 (two) times daily. 04/14/17   Barrett Henle, PA-C    Family History No family history on file.  Social History Social History  Substance Use Topics  . Smoking status: Current Every Day Smoker    Packs/day: 0.25    Years: 10.00  . Smokeless tobacco: Never Used  . Alcohol use No     Allergies   Tylenol [acetaminophen]   Review of Systems Review of Systems  Gastrointestinal: Positive for abdominal pain (chronic) and rectal pain.  Musculoskeletal: Positive for back pain (chronic).  All other systems reviewed and are negative.  Physical Exam Updated Vital Signs BP (!) 142/98 (BP Location: Right Arm)   Pulse 80   Temp 98.8 F (37.1 C) (Oral)   Resp 19   SpO2 99%   Physical Exam  Constitutional: He is oriented to person, place, and time. He appears well-developed and well-nourished. No distress.  HENT:  Head: Normocephalic and atraumatic.  Mouth/Throat: Oropharynx is clear and moist. No oropharyngeal exudate.  Eyes: Conjunctivae and EOM are normal. Right eye exhibits no discharge. Left eye  exhibits no discharge. No scleral icterus.  Neck: Normal range of motion. Neck supple.  Cardiovascular: Normal rate, regular rhythm, normal heart sounds and intact distal pulses.   Pulmonary/Chest: Effort normal and breath sounds normal. No respiratory distress. He has no wheezes. He has no rales. He exhibits no tenderness.  Abdominal: Soft. Bowel sounds are normal. He exhibits no distension and no mass. There is no tenderness. There is no rebound and no guarding. No hernia. Hernia confirmed negative in the right inguinal area and confirmed negative in the left inguinal area.  Genitourinary: Testes normal and penis normal. Rectal exam shows external hemorrhoid, internal hemorrhoid and tenderness. Right testis shows no mass, no swelling and no tenderness. Left testis shows no mass, no swelling and no tenderness. Circumcised. No penile erythema or penile tenderness. No discharge found.  Genitourinary Comments: Single small thrombosed external hemorrhoid, TTP. Small nonthrombosed internal hemorrhoid present on rectal exam. No active bleeding  Musculoskeletal: He exhibits no edema.  Lymphadenopathy: No inguinal adenopathy noted on the right or left side.  Neurological: He is alert and oriented to person, place, and time.  Skin: Skin is warm and dry. He is not diaphoretic.  Well-healing midline lumbar surgical incision site present with no surrounding swelling, erythema, warmth, induration, fluctuance or drainage. On tender.  Nursing note and vitals reviewed.    ED Treatments / Results  Labs (all labs ordered are listed, but only abnormal results are displayed) Labs Reviewed - No data to display  EKG  EKG Interpretation None       Radiology No results found.  Procedures Procedures (including critical care time)  Medications Ordered in ED Medications  oxyCODONE-acetaminophen (PERCOCET/ROXICET) 5-325 MG per tablet 1 tablet (not administered)     Initial Impression / Assessment and  Plan / ED Course  I have reviewed the triage vital signs and the nursing notes.  Pertinent labs & imaging results that were available during my care of the patient were reviewed by me and considered in my medical decision making (see chart for details).     Patient presents with rectal pain for the past 3 days. Denies fever, diarrhea, constipation, rectal bleeding. Reports history of chronic abdominal pain and low back pain which are consistent with his typical chronic pain and are unchanged in nature. Denies fever. VSS. Exam revealed small thrombosed external hemorrhoid and small nonthrombosed internal hemorrhoid, tender to palpation, no active bleeding. Abdomen soft and nontender. Remaining exam unremarkable. Patient reports having lumbar back surgery performed a few weeks ago but states he has been healing fine without any complications. No neuro deficits on exam. No back pain red flags. I suspect patient's symptoms are related to hemorrhoids. Plan to discharge patient home with dibucaine ointment, stool softener and sitz baths. Advised patient to continue taking his home pain medications as prescribed. Advised to follow up with PCP as needed. Discussed return precautions.  Final Clinical Impressions(s) / ED Diagnoses   Final diagnoses:  Hemorrhoids, unspecified hemorrhoid type    New Prescriptions  New Prescriptions   DIBUCAINE (NUPERCAINAL) 1 % OINT    Place 1 application rectally 3 (three) times daily as needed for pain.   PSYLLIUM (METAMUCIL SMOOTH TEXTURE) 28 % PACKET    Take 1 packet by mouth 2 (two) times daily.     Barrett Henleadeau, Mayan Kloepfer Elizabeth, PA-C 04/14/17 0155    Ward, Layla MawKristen N, DO 04/14/17 [phone]

## 2017-04-14 NOTE — ED Notes (Signed)
PT states understanding of care given, follow up care, and medication prescribed. PT ambulated from ED to car with a steady gait. 

## 2017-04-14 NOTE — Discharge Instructions (Signed)
Take your medications as prescribed. I also recommend changing fluids at home to remain hydrated. Refrain from sitting on the toilet for long or straining while having a bowel movement  the mucosa worsening hemorrhoids. Recommend performing sitz baths daily until her symptoms have improved. Follow-up with your primary care provider in the next 4-5 days if her symptoms have not improved. Please return to the Emergency Department if symptoms worsen or new onset of fever, new/worsening abdominal pain, vomiting, rectal bleeding, constipation, diarrhea.

## 2017-04-17 ENCOUNTER — Ambulatory Visit (INDEPENDENT_AMBULATORY_CARE_PROVIDER_SITE_OTHER): Payer: BLUE CROSS/BLUE SHIELD | Admitting: Medical

## 2017-04-17 ENCOUNTER — Telehealth: Payer: Self-pay | Admitting: Medical

## 2017-04-17 ENCOUNTER — Other Ambulatory Visit: Payer: Self-pay | Admitting: Medical

## 2017-04-17 ENCOUNTER — Encounter: Payer: Self-pay | Admitting: Medical

## 2017-04-17 VITALS — BP 132/74 | HR 85 | Wt 200.0 lb

## 2017-04-17 DIAGNOSIS — M542 Cervicalgia: Secondary | ICD-10-CM | POA: Diagnosis not present

## 2017-04-17 DIAGNOSIS — G4489 Other headache syndrome: Secondary | ICD-10-CM | POA: Diagnosis not present

## 2017-04-17 DIAGNOSIS — Z9889 Other specified postprocedural states: Secondary | ICD-10-CM | POA: Insufficient documentation

## 2017-04-17 DIAGNOSIS — K648 Other hemorrhoids: Secondary | ICD-10-CM | POA: Diagnosis not present

## 2017-04-17 DIAGNOSIS — K644 Residual hemorrhoidal skin tags: Secondary | ICD-10-CM | POA: Insufficient documentation

## 2017-04-17 DIAGNOSIS — M545 Low back pain, unspecified: Secondary | ICD-10-CM | POA: Insufficient documentation

## 2017-04-17 DIAGNOSIS — R222 Localized swelling, mass and lump, trunk: Secondary | ICD-10-CM

## 2017-04-17 DIAGNOSIS — G8929 Other chronic pain: Secondary | ICD-10-CM | POA: Diagnosis not present

## 2017-04-17 MED ORDER — HYDROCORTISONE 2.5 % RE CREA: 1.0000 "application " | TOPICAL_CREAM | Freq: Two times a day (BID) | RECTAL | 0 refills | Status: DC

## 2017-04-17 MED ORDER — DOCUSATE SODIUM 100 MG PO CAPS: 100.0000 mg | ORAL_CAPSULE | Freq: Two times a day (BID) | ORAL | 0 refills | Status: DC

## 2017-04-17 MED ORDER — HYDROCORTISONE ACETATE 25 MG RE SUPP: 25.0000 mg | Freq: Two times a day (BID) | RECTAL | 0 refills | Status: DC

## 2017-04-17 NOTE — Telephone Encounter (Signed)
Pt said the suppositories that were sent to the pharmacy are too expensive. They are $135. Pt said Sergio Boyd told him to call if any of the meds were too expensive.

## 2017-04-17 NOTE — Telephone Encounter (Signed)
I sent different suppository

## 2017-04-17 NOTE — Patient Instructions (Addendum)
Recommendations:  I spoke to [facility]  Your MRI lumbar spine appointment is tomorrow morning at 9:15am  You are scheduled to see Dr. Bevely Palmeritty at 10:45am tomorrow  Dr. Mervyn Gayitty's office called out Fioricet for headaches, Medrol dose pak steroid for the pain and inflammation, and Valium to use prior to MRI tomorrow to relax you  I told them about your neck pain and they will address this as well when you talk with Dr. Bevely Palmeritty  Hemorrhoids  Continue SITZ baths/hot soapy bath soaks for 20 minutes several times daily  Begin Ansuol suppository twice daily the next several days  Begin Anusol cream to the external hemorrhoid 3-4 times daily  Use stool softener/Colace oral tablet to soften stool  Use Tucks wipes or baby wipes while tender  Drink plenty of water  Avoid heavy meals, lots of cheese or other constipating foods    Hemorrhoids Hemorrhoids are swollen veins in and around the rectum or anus. Hemorrhoids can cause pain, itching, or bleeding. Most of the time, they do not cause serious problems. They usually get better with diet changes, lifestyle changes, and other home treatments. Follow these instructions at home: Eating and drinking   Eat foods that have fiber, such as whole grains, beans, nuts, fruits, and vegetables. Ask your doctor about taking products that have added fiber (fibersupplements).  Drink enough fluid to keep your pee (urine) clear or pale yellow. For Pain and Swelling   Take a warm-water bath (sitz bath) for 20 minutes to ease pain. Do this 3-4 times a day.  If directed, put ice on the painful area. It may be helpful to use ice between your warm baths.  Put ice in a plastic bag.  Place a towel between your skin and the bag.  Leave the ice on for 20 minutes, 2-3 times a day. General instructions   Take over-the-counter and prescription medicines only as told by your doctor.  Medicated creams and medicines that are inserted into the anus  (suppositories) may be used or applied as told.  Exercise often.  Go to the bathroom when you have the urge to poop (to have a bowel movement). Do not wait.  Avoid pushing too hard (straining) when you poop.  Keep the butt area dry and clean. Use wet toilet paper or moist paper towels.  Do not sit on the toilet for a long time. Contact a doctor if:  You have any of these:  Pain and swelling that do not get better with treatment or medicine.  Bleeding that will not stop.  Trouble pooping or you cannot poop.  Pain or swelling outside the area of the hemorrhoids. This information is not intended to replace advice given to you by your health care provider. Make sure you discuss any questions you have with your health care provider. Document Released: 09/05/2008 Document Revised: 05/04/2016 Document Reviewed: 08/11/2015 Elsevier Interactive Patient Education  2017 [employer]

## 2017-04-17 NOTE — Progress Notes (Signed)
Subjective: Chief Complaint  Patient presents with  . Hospitalization Follow-up    hemmorrids, pt was in last friday to saturday morning    Here for several pain concerns.  Of note, I have only seen him prior for pain related complaints.  He hasn't been one to come in for routine physical or preventative care.  He was seen in the ED 3 days ago for rectal pain and hemorrhoids.  Was give suppository at the ED and topical anesthetic ointment.  Has some improvement yesterday but not back to normal.  Not worse, but still having rectal pain.  He denies hx/o hemorrhoids.  Using SITZ baths currently.  Has seen some pink coloration on tissue, but no other blood in stool.   He had back [facility] with Dr. Bevely Palmeritty.  He has continued to have low back pain and right posterior thigh pain since surgery.  Lying does improve the pain somewhat.  He notes "complications" from the surgery.   He also notes in the last several days he has noted pain in lumbar incision area and swelling in same area.  No fever, no warmth, no drainage. He notes that he called neurosurgery office requesting updated MRI and recheck on his complications, pain in leg and back.   He also c/o chronic neck pain, worse in recent weeks, including right arm pain throughout, worse upper arm, tingling down right upper arm and proximal forearm.   Pain radiates throughout upper back and neck and head.  Been having headaches frontal and posterior that he attributes to the neck issue.   Headache and pain is limiting his ability to function on his job in general.  Staying in pain.   He is requesting C spine MRI as well.  He notes [facility] spoke with him and is trying to get him a repeat L spine MRI tomorrow.     Denies vision or hearing changes, no slurred speech, no pop or thundercrack, no nausea, no vomiting, no fever, no confusion  Past Medical History:  Diagnosis Date  . Chronic back pain    herniated disc  . History of migraine    last one a month ago  . Joint pain    Current Outpatient Prescriptions on File Prior to Visit  Medication Sig Dispense Refill  . dibucaine (NUPERCAINAL) 1 % OINT Place 1 application rectally 3 (three) times daily as needed for pain. 28 g 0  . oxyCODONE (OXY IR/ROXICODONE) 5 MG immediate release tablet Take 1-2 tablets (5-10 mg total) by mouth every 6 (six) hours as needed for moderate pain, severe pain or breakthrough pain. 15 tablet 0  . psyllium (METAMUCIL SMOOTH TEXTURE) 28 % packet Take 1 packet by mouth 2 (two) times daily. 30 packet 0   No current facility-administered medications on file prior to visit.    Review of Systems Constitutional: -fever, -chills, -sweats, -unexpected weight change,-fatigue ENT: -runny nose, -ear pain, -sore throat Cardiology:  -chest pain, -palpitations, -edema Respiratory: -cough, -shortness of breath, -wheezing Gastroenterology: -abdominal pain, -nausea, -vomiting, -diarrhea, -constipation Musculoskeletal: +arthralgias, +myalgias, -joint swelling, +back pain Ophthalmology: -vision changes Urology: -dysuria, -difficulty urinating, -hematuria, -urinary frequency, -urgency Neurology: +headache, +weakness, +tingling, +numbness    Objective: BP 132/74   Pulse 85   Wt 200 lb (90.7 kg)   SpO2 97%   BMI 28.70 kg/m   Wt Readings from Last 3 Encounters:  04/17/17 200 lb (90.7 kg)  02/20/17 210 lb 12.8 oz (95.6 kg)  12/05/16 210  lb (95.3 kg)    General appearance: alert, no distress, WD/WN, AA male, pacing in the room seemingly in pain Neck: tender to palpation mildly lateral and posterior neck, decreased ROM in general, but no lymphadenopathy, no thyromegaly, no masses Heart: RRR, normal S1, S2, no murmurs Lungs: CTA bilaterally, no wheezes, rhonchi, or rales Abdomen: +bs, soft, non tender, non distended, no masses, no hepatomegaly, no splenomegaly Back: tender throughout upper and lower back somewhat out  of proportion to expected tenderness.  There is lumbar surgical scar, but central and right of scar is a moderately swollen area running the length of the scar, tender in this area, fluid palpable but no warmth, no induration Musculoskeletal: nontender, no swelling, no obvious deformity Extremities: no edema, no cyanosis, no clubbing Pulses: 2+ symmetric, upper and lower extremities, normal cap refill Neurological: alert, oriented x 3, CN2-12 intact, strength seems WNL, but he seems a little weaker right arm than left, but can't tell if he is giving the same effort, otherwise strength normal upper extremities and lower extremities, sensation normal throughout, DTRs 2+ throughout, no cerebellar signs, gait normal Psychiatric: seems to be in pain, otherwise normal affect, behavior normal, pleasant  Rectal- mildly tender left lateral 2cm red swollen hemorrhoid , not purplish and not overly thrombosed, there is also somewhat swollen hemorrhoid visible at anal verge, mildly tender  Assessment: Encounter Diagnoses  Name Primary?  . External hemorrhoid Yes  . Internal hemorrhoids   . Chronic neck pain   . Chronic low back pain, unspecified back pain laterality, with sciatica presence unspecified   . Headache syndrome   . Localized swelling of back   . H/O lumbosacral spine surgery     Plan: Discussed his several concerns, recommendations.   I called and spoke to [facility] about case and they have already called in medication for him and have him scheduled for L spine MRI and visit tomorrow in light of the findings and concerns.   Recommendations:  I spoke to [facility]  Your MRI lumbar spine appointment is tomorrow morning at 9:15am  You are scheduled to see Dr. Bevely Palmer at 10:45am tomorrow  Dr. Mervyn Gay office called out Fioricet for headaches, Medrol dose pak steroid for the pain and inflammation, and Valium to use prior to MRI tomorrow to relax you  I told them about  your neck pain and they will address this as well when you talk with Dr. Bevely Palmer  Hemorrhoids  Continue SITZ baths/hot soapy bath soaks for 20 minutes several times daily  Begin Ansuol suppository twice daily the next several days  Begin Anusol cream to the external hemorrhoid 3-4 times daily  Use stool softener/Colace oral tablet to soften stool  Use Tucks wipes or baby wipes while tender  Drink plenty of water  Avoid heavy meals, lots of cheese or other constipating foods    Dravon was seen today for hospitalization follow-up.  Diagnoses and all orders for this visit:  External hemorrhoid  Internal hemorrhoids  Chronic neck pain  Chronic low back pain, unspecified back pain laterality, with sciatica presence unspecified  Headache syndrome  Localized swelling of back  H/O lumbosacral spine surgery  Other orders -     hydrocortisone (ANUSOL-HC) 2.5 % rectal cream; Place 1 application rectally 2 (two) times daily. -     hydrocortisone (ANUSOL-HC) 25 MG suppository; Place 1 suppository (25 mg total) rectally 2 (two) times daily. -     docusate sodium (COLACE) 100 MG capsule; Take 1 capsule (100  mg total) by mouth 2 (two) times daily.

## 2017-04-18 ENCOUNTER — Other Ambulatory Visit: Payer: Self-pay | Admitting: Medical

## 2017-04-18 ENCOUNTER — Ambulatory Visit
Admission: RE | Admit: 2017-04-18 | Discharge: 2017-04-18 | Disposition: A | Discharge status: Home / Self Care | Payer: BLUE CROSS/BLUE SHIELD | Source: Ambulatory Visit | Attending: Neurological Surgery | Admitting: Neurological Surgery

## 2017-04-18 ENCOUNTER — Other Ambulatory Visit: Payer: Self-pay | Admitting: Neurological Surgery

## 2017-04-18 DIAGNOSIS — M5416 Radiculopathy, lumbar region: Secondary | ICD-10-CM

## 2017-04-18 DIAGNOSIS — M9953 Intervertebral disc stenosis of neural canal of lumbar region: Secondary | ICD-10-CM | POA: Diagnosis not present

## 2017-04-18 MED ORDER — GADOBENATE DIMEGLUMINE 529 MG/ML IV SOLN
18.0000 mL | Freq: Once | INTRAVENOUS | Status: AC | PRN
  Administered 2017-04-18: 18 mL via INTRAVENOUS

## 2017-04-18 MED ORDER — HYDROCORTISONE ACETATE 25 MG RE SUPP: 25.0000 mg | Freq: Two times a day (BID) | RECTAL | 0 refills | Status: DC

## 2017-04-18 NOTE — Telephone Encounter (Signed)
Left message for pt that med was sent to pharmacy °

## 2017-04-19 ENCOUNTER — Other Ambulatory Visit: Payer: Self-pay | Admitting: Neurological Surgery

## 2017-04-19 ENCOUNTER — Encounter (HOSPITAL_COMMUNITY): Payer: Self-pay | Admitting: *Deleted

## 2017-04-20 ENCOUNTER — Encounter (HOSPITAL_COMMUNITY)
Admission: RE | Disposition: A | Discharge status: Home / Self Care | Payer: Self-pay | Source: Ambulatory Visit | Attending: Neurological Surgery

## 2017-04-20 ENCOUNTER — Encounter (HOSPITAL_COMMUNITY): Payer: Self-pay

## 2017-04-20 ENCOUNTER — Inpatient Hospital Stay (HOSPITAL_COMMUNITY)
Admission: RE | Admit: 2017-04-20 | Discharge: 2017-05-08 | DRG: 030 | Disposition: A | Discharge status: Home / Self Care | Payer: BLUE CROSS/BLUE SHIELD | Source: Ambulatory Visit | Attending: Neurological Surgery | Admitting: Neurological Surgery

## 2017-04-20 ENCOUNTER — Ambulatory Visit (HOSPITAL_COMMUNITY): Payer: BLUE CROSS/BLUE SHIELD | Admitting: Certified Registered Nurse Anesthetist

## 2017-04-20 DIAGNOSIS — G9782 Other postprocedural complications and disorders of nervous system: Principal | ICD-10-CM | POA: Diagnosis present

## 2017-04-20 DIAGNOSIS — G9619 Other disorders of meninges, not elsewhere classified: Secondary | ICD-10-CM | POA: Diagnosis present

## 2017-04-20 DIAGNOSIS — K219 Gastro-esophageal reflux disease without esophagitis: Secondary | ICD-10-CM | POA: Diagnosis not present

## 2017-04-20 DIAGNOSIS — M79662 Pain in left lower leg: Secondary | ICD-10-CM | POA: Diagnosis present

## 2017-04-20 DIAGNOSIS — K5900 Constipation, unspecified: Secondary | ICD-10-CM | POA: Diagnosis not present

## 2017-04-20 DIAGNOSIS — M50222 Other cervical disc displacement at C5-C6 level: Secondary | ICD-10-CM | POA: Diagnosis not present

## 2017-04-20 DIAGNOSIS — R519 Headache, unspecified: Secondary | ICD-10-CM

## 2017-04-20 DIAGNOSIS — M79661 Pain in right lower leg: Secondary | ICD-10-CM | POA: Diagnosis present

## 2017-04-20 DIAGNOSIS — M5126 Other intervertebral disc displacement, lumbar region: Secondary | ICD-10-CM | POA: Diagnosis present

## 2017-04-20 DIAGNOSIS — G8929 Other chronic pain: Secondary | ICD-10-CM | POA: Diagnosis not present

## 2017-04-20 DIAGNOSIS — Y838 Other surgical procedures as the cause of abnormal reaction of the patient, or of later complication, without mention of misadventure at the time of the procedure: Secondary | ICD-10-CM | POA: Diagnosis present

## 2017-04-20 DIAGNOSIS — M50221 Other cervical disc displacement at C4-C5 level: Secondary | ICD-10-CM | POA: Diagnosis not present

## 2017-04-20 DIAGNOSIS — R000 Tachycardia, unspecified: Secondary | ICD-10-CM | POA: Diagnosis not present

## 2017-04-20 DIAGNOSIS — M4727 Other spondylosis with radiculopathy, lumbosacral region: Secondary | ICD-10-CM | POA: Diagnosis not present

## 2017-04-20 DIAGNOSIS — R51 Headache: Secondary | ICD-10-CM | POA: Diagnosis present

## 2017-04-20 DIAGNOSIS — M545 Low back pain: Secondary | ICD-10-CM | POA: Diagnosis not present

## 2017-04-20 DIAGNOSIS — R109 Unspecified abdominal pain: Secondary | ICD-10-CM | POA: Diagnosis not present

## 2017-04-20 DIAGNOSIS — G960 Cerebrospinal fluid leak: Secondary | ICD-10-CM | POA: Diagnosis not present

## 2017-04-20 DIAGNOSIS — M79609 Pain in unspecified limb: Secondary | ICD-10-CM | POA: Diagnosis not present

## 2017-04-20 DIAGNOSIS — F17210 Nicotine dependence, cigarettes, uncomplicated: Secondary | ICD-10-CM | POA: Diagnosis not present

## 2017-04-20 DIAGNOSIS — G971 Other reaction to spinal and lumbar puncture: Secondary | ICD-10-CM | POA: Diagnosis not present

## 2017-04-20 DIAGNOSIS — M5117 Intervertebral disc disorders with radiculopathy, lumbosacral region: Secondary | ICD-10-CM | POA: Diagnosis not present

## 2017-04-20 HISTORY — PX: LUMBAR LAMINECTOMY/DECOMPRESSION MICRODISCECTOMY: SHX5026

## 2017-04-20 HISTORY — DX: Unspecified osteoarthritis, unspecified site: M19.90

## 2017-04-20 HISTORY — DX: Headache, unspecified: R51.9

## 2017-04-20 HISTORY — DX: Headache: R51

## 2017-04-20 HISTORY — DX: Gastro-esophageal reflux disease without esophagitis: K21.9

## 2017-04-20 LAB — CBC
HCT: 43 % (ref 39.0–52.0)
Hemoglobin: 14.7 g/dL (ref 13.0–17.0)
MCH: 29.8 pg (ref 26.0–34.0)
MCHC: 34.2 g/dL (ref 30.0–36.0)
MCV: 87.2 fL (ref 78.0–100.0)
Platelets: 276 10*3/uL (ref 150–400)
RBC: 4.93 MIL/uL (ref 4.22–5.81)
RDW: 15.2 % (ref 11.5–15.5)
WBC: 6.6 10*3/uL (ref 4.0–10.5)

## 2017-04-20 LAB — BASIC METABOLIC PANEL
Anion gap: 11 (ref 5–15)
BUN: 18 mg/dL (ref 6–20)
CO2: 20 mmol/L — ABNORMAL LOW (ref 22–32)
Calcium: 10.1 mg/dL (ref 8.9–10.3)
Chloride: 101 mmol/L (ref 101–111)
Creatinine, Ser: 1.18 mg/dL (ref 0.61–1.24)
GFR calc Af Amer: >60 mL/min (ref >60)
GFR calc non Af Amer: >60 mL/min (ref >60)
Glucose, Bld: 96 mg/dL (ref 65–99)
Potassium: 3.6 mmol/L (ref 3.5–5.1)
Sodium: 132 mmol/L — ABNORMAL LOW (ref 135–145)

## 2017-04-20 LAB — SURGICAL PCR SCREEN
MRSA, PCR: NEGATIVE
Staphylococcus aureus: NEGATIVE

## 2017-04-20 SURGERY — LUMBAR LAMINECTOMY/DECOMPRESSION MICRODISCECTOMY 1 LEVEL
Anesthesia: General | Site: Back

## 2017-04-20 MED ORDER — ONDANSETRON HCL 4 MG/2ML IJ SOLN
INTRAMUSCULAR | Status: AC
  Filled 2017-04-20: qty 2

## 2017-04-20 MED ORDER — MENTHOL 3 MG MT LOZG: 1.0000 | LOZENGE | OROMUCOSAL | Status: DC | PRN

## 2017-04-20 MED ORDER — 0.9 % SODIUM CHLORIDE (POUR BTL) OPTIME
TOPICAL | Status: DC | PRN
  Administered 2017-04-20: 1000 mL

## 2017-04-20 MED ORDER — HYDROMORPHONE HCL 1 MG/ML IJ SOLN
1.0000 mg | INTRAMUSCULAR | Status: DC | PRN
  Administered 2017-04-20 – 2017-04-30 (×23): 1 mg via INTRAVENOUS
  Filled 2017-04-20 (×23): qty 1

## 2017-04-20 MED ORDER — SODIUM CHLORIDE 0.9% FLUSH
3.0000 mL | Freq: Two times a day (BID) | INTRAVENOUS | Status: DC
  Administered 2017-04-20 – 2017-04-26 (×10): 3 mL via INTRAVENOUS
  Administered 2017-04-27: 10 mL via INTRAVENOUS
  Administered 2017-04-27 – 2017-05-08 (×20): 3 mL via INTRAVENOUS

## 2017-04-20 MED ORDER — ONDANSETRON HCL 4 MG/2ML IJ SOLN: 4.0000 mg | Freq: Once | INTRAMUSCULAR | Status: DC | PRN

## 2017-04-20 MED ORDER — PHENYLEPHRINE 40 MCG/ML (10ML) SYRINGE FOR IV PUSH (FOR BLOOD PRESSURE SUPPORT)
PREFILLED_SYRINGE | INTRAVENOUS | Status: AC
  Filled 2017-04-20: qty 20

## 2017-04-20 MED ORDER — ONDANSETRON HCL 4 MG/2ML IJ SOLN
INTRAMUSCULAR | Status: DC | PRN
  Administered 2017-04-20: 4 mg via INTRAVENOUS

## 2017-04-20 MED ORDER — MUPIROCIN 2 % EX OINT
TOPICAL_OINTMENT | CUTANEOUS | Status: AC
  Administered 2017-04-20: 13:00:00
  Filled 2017-04-20: qty 22

## 2017-04-20 MED ORDER — THROMBIN 5000 UNITS EX SOLR
CUTANEOUS | Status: DC | PRN
  Administered 2017-04-20 (×2): 5000 [IU] via TOPICAL

## 2017-04-20 MED ORDER — PROPOFOL 10 MG/ML IV BOLUS
INTRAVENOUS | Status: AC
  Filled 2017-04-20: qty 20

## 2017-04-20 MED ORDER — NALOXEGOL OXALATE 25 MG PO TABS
25.0000 mg | ORAL_TABLET | Freq: Every day | ORAL | Status: DC
  Administered 2017-04-24 – 2017-05-07 (×9): 25 mg via ORAL
  Filled 2017-04-20 (×19): qty 1

## 2017-04-20 MED ORDER — OXYCODONE HCL ER 10 MG PO T12A
10.0000 mg | EXTENDED_RELEASE_TABLET | Freq: Two times a day (BID) | ORAL | Status: DC
  Administered 2017-04-20 – 2017-04-21 (×2): 10 mg via ORAL
  Filled 2017-04-20 (×2): qty 1

## 2017-04-20 MED ORDER — ONDANSETRON HCL 4 MG/2ML IJ SOLN
4.0000 mg | Freq: Four times a day (QID) | INTRAMUSCULAR | Status: DC | PRN
  Administered 2017-04-24: 4 mg via INTRAVENOUS

## 2017-04-20 MED ORDER — PHENOL 1.4 % MT LIQD: 1.0000 | OROMUCOSAL | Status: DC | PRN

## 2017-04-20 MED ORDER — SUGAMMADEX SODIUM 200 MG/2ML IV SOLN
INTRAVENOUS | Status: DC | PRN
  Administered 2017-04-20: 180 mg via INTRAVENOUS

## 2017-04-20 MED ORDER — SUCCINYLCHOLINE CHLORIDE 200 MG/10ML IV SOSY
PREFILLED_SYRINGE | INTRAVENOUS | Status: AC
  Filled 2017-04-20: qty 10

## 2017-04-20 MED ORDER — PSYLLIUM 95 % PO PACK
1.0000 | PACK | Freq: Two times a day (BID) | ORAL | Status: DC
  Administered 2017-04-21 – 2017-04-30 (×8): 1 via ORAL
  Filled 2017-04-20 (×38): qty 1

## 2017-04-20 MED ORDER — ONDANSETRON HCL 4 MG PO TABS: 4.0000 mg | ORAL_TABLET | Freq: Four times a day (QID) | ORAL | Status: DC | PRN

## 2017-04-20 MED ORDER — MIDAZOLAM HCL 2 MG/2ML IJ SOLN
INTRAMUSCULAR | Status: AC
  Filled 2017-04-20: qty 2

## 2017-04-20 MED ORDER — CELECOXIB 200 MG PO CAPS
200.0000 mg | ORAL_CAPSULE | Freq: Two times a day (BID) | ORAL | Status: DC
  Administered 2017-04-20 – 2017-05-07 (×33): 200 mg via ORAL
  Filled 2017-04-20 (×36): qty 1

## 2017-04-20 MED ORDER — LIDOCAINE-EPINEPHRINE 2 %-1:100000 IJ SOLN
INTRAMUSCULAR | Status: AC
  Filled 2017-04-20: qty 1

## 2017-04-20 MED ORDER — METHYLPREDNISOLONE ACETATE 80 MG/ML IJ SUSP
INTRAMUSCULAR | Status: AC
  Filled 2017-04-20: qty 1

## 2017-04-20 MED ORDER — VANCOMYCIN HCL 1000 MG IV SOLR
INTRAVENOUS | Status: AC
  Filled 2017-04-20: qty 1000

## 2017-04-20 MED ORDER — BISACODYL 10 MG RE SUPP: 10.0000 mg | Freq: Every day | RECTAL | Status: DC | PRN

## 2017-04-20 MED ORDER — DOCUSATE SODIUM 100 MG PO CAPS
100.0000 mg | ORAL_CAPSULE | Freq: Two times a day (BID) | ORAL | Status: DC
Start: 2017-04-20 — End: 2017-04-20

## 2017-04-20 MED ORDER — CHLORHEXIDINE GLUCONATE CLOTH 2 % EX PADS: 6.0000 | MEDICATED_PAD | Freq: Once | CUTANEOUS | Status: DC

## 2017-04-20 MED ORDER — SODIUM CHLORIDE 0.9 % IV SOLN: 250.0000 mL | INTRAVENOUS | Status: DC

## 2017-04-20 MED ORDER — THROMBIN 5000 UNITS EX SOLR
CUTANEOUS | Status: AC
  Filled 2017-04-20: qty 15000

## 2017-04-20 MED ORDER — LIDOCAINE-EPINEPHRINE 2 %-1:100000 IJ SOLN
INTRAMUSCULAR | Status: DC | PRN
  Administered 2017-04-20: 9 mL

## 2017-04-20 MED ORDER — KETOROLAC TROMETHAMINE 30 MG/ML IJ SOLN
INTRAMUSCULAR | Status: AC
  Filled 2017-04-20: qty 1

## 2017-04-20 MED ORDER — MIDAZOLAM HCL 5 MG/5ML IJ SOLN
INTRAMUSCULAR | Status: DC | PRN
  Administered 2017-04-20: 2 mg via INTRAVENOUS

## 2017-04-20 MED ORDER — ALUM & MAG HYDROXIDE-SIMETH 200-200-20 MG/5ML PO SUSP
30.0000 mL | Freq: Four times a day (QID) | ORAL | Status: DC | PRN
  Administered 2017-04-23: 30 mL via ORAL
  Filled 2017-04-20 (×2): qty 30

## 2017-04-20 MED ORDER — LACTATED RINGERS IV SOLN
INTRAVENOUS | Status: DC
  Administered 2017-04-20 (×2): via INTRAVENOUS

## 2017-04-20 MED ORDER — FLEET ENEMA 7-19 GM/118ML RE ENEM: 1.0000 | ENEMA | Freq: Once | RECTAL | Status: DC | PRN

## 2017-04-20 MED ORDER — CEFAZOLIN SODIUM-DEXTROSE 1-4 GM/50ML-% IV SOLN
1.0000 g | Freq: Three times a day (TID) | INTRAVENOUS | Status: AC
  Administered 2017-04-20 – 2017-04-21 (×2): 1 g via INTRAVENOUS
  Filled 2017-04-20 (×2): qty 50

## 2017-04-20 MED ORDER — BUPIVACAINE HCL (PF) 0.25 % IJ SOLN
INTRAMUSCULAR | Status: AC
  Filled 2017-04-20: qty 30

## 2017-04-20 MED ORDER — FENTANYL CITRATE (PF) 100 MCG/2ML IJ SOLN
INTRAMUSCULAR | Status: DC | PRN
  Administered 2017-04-20: 100 ug via INTRAVENOUS
  Administered 2017-04-20 (×4): 50 ug via INTRAVENOUS

## 2017-04-20 MED ORDER — CYCLOBENZAPRINE HCL 10 MG PO TABS
10.0000 mg | ORAL_TABLET | Freq: Three times a day (TID) | ORAL | Status: DC | PRN
Start: 2017-04-20 — End: 2017-04-21
  Administered 2017-04-21: 10 mg via ORAL
  Filled 2017-04-20: qty 1

## 2017-04-20 MED ORDER — HYDROMORPHONE HCL 1 MG/ML IJ SOLN
0.2500 mg | INTRAMUSCULAR | Status: DC | PRN
  Administered 2017-04-20 (×2): 0.5 mg via INTRAVENOUS

## 2017-04-20 MED ORDER — DOCUSATE SODIUM 100 MG PO CAPS
100.0000 mg | ORAL_CAPSULE | Freq: Two times a day (BID) | ORAL | Status: DC
Start: 2017-04-20 — End: 2017-05-08
  Administered 2017-04-20 – 2017-05-08 (×34): 100 mg via ORAL
  Filled 2017-04-20 (×34): qty 1

## 2017-04-20 MED ORDER — GABAPENTIN 300 MG PO CAPS
300.0000 mg | ORAL_CAPSULE | Freq: Three times a day (TID) | ORAL | Status: DC
  Administered 2017-04-20 – 2017-05-08 (×51): 300 mg via ORAL
  Filled 2017-04-20 (×51): qty 1

## 2017-04-20 MED ORDER — MEPERIDINE HCL 25 MG/ML IJ SOLN: 6.2500 mg | INTRAMUSCULAR | Status: DC | PRN

## 2017-04-20 MED ORDER — THROMBIN 5000 UNITS EX SOLR
CUTANEOUS | Status: DC | PRN
  Administered 2017-04-20: 15:00:00 via TOPICAL

## 2017-04-20 MED ORDER — FENTANYL CITRATE (PF) 250 MCG/5ML IJ SOLN
INTRAMUSCULAR | Status: AC
  Filled 2017-04-20: qty 5

## 2017-04-20 MED ORDER — SODIUM CHLORIDE 0.9% FLUSH: 3.0000 mL | INTRAVENOUS | Status: DC | PRN

## 2017-04-20 MED ORDER — BUPIVACAINE-EPINEPHRINE (PF) 0.5% -1:200000 IJ SOLN
INTRAMUSCULAR | Status: DC | PRN
  Administered 2017-04-20: 9 mL

## 2017-04-20 MED ORDER — CEFAZOLIN SODIUM-DEXTROSE 2-4 GM/100ML-% IV SOLN
2.0000 g | INTRAVENOUS | Status: AC
  Administered 2017-04-20: 2 g via INTRAVENOUS

## 2017-04-20 MED ORDER — ROCURONIUM BROMIDE 100 MG/10ML IV SOLN
INTRAVENOUS | Status: DC | PRN
  Administered 2017-04-20: 20 mg via INTRAVENOUS
  Administered 2017-04-20: 10 mg via INTRAVENOUS
  Administered 2017-04-20: 50 mg via INTRAVENOUS
  Administered 2017-04-20: 10 mg via INTRAVENOUS

## 2017-04-20 MED ORDER — DIAZEPAM 5 MG PO TABS
5.0000 mg | ORAL_TABLET | Freq: Four times a day (QID) | ORAL | Status: DC | PRN
  Filled 2017-04-20: qty 1

## 2017-04-20 MED ORDER — SODIUM CHLORIDE 0.9 % IR SOLN
Status: DC | PRN
  Administered 2017-04-20: 15:00:00

## 2017-04-20 MED ORDER — METHYLPREDNISOLONE ACETATE 80 MG/ML IJ SUSP
INTRAMUSCULAR | Status: DC | PRN
  Administered 2017-04-20: 80 mg

## 2017-04-20 MED ORDER — LIDOCAINE HCL (CARDIAC) 20 MG/ML IV SOLN
INTRAVENOUS | Status: DC | PRN
  Administered 2017-04-20: 100 mg via INTRAVENOUS

## 2017-04-20 MED ORDER — PROPOFOL 10 MG/ML IV BOLUS
INTRAVENOUS | Status: DC | PRN
  Administered 2017-04-20: 130 mg via INTRAVENOUS

## 2017-04-20 MED ORDER — ACETAMINOPHEN 500 MG PO TABS
1000.0000 mg | ORAL_TABLET | Freq: Four times a day (QID) | ORAL | Status: DC
  Filled 2017-04-20: qty 2

## 2017-04-20 MED ORDER — HEMOSTATIC AGENTS (NO CHARGE) OPTIME
TOPICAL | Status: DC | PRN
  Administered 2017-04-20 (×2): 1 via TOPICAL

## 2017-04-20 MED ORDER — EPHEDRINE 5 MG/ML INJ
INTRAVENOUS | Status: AC
  Filled 2017-04-20: qty 10

## 2017-04-20 MED ORDER — HYDROMORPHONE HCL 1 MG/ML IJ SOLN
INTRAMUSCULAR | Status: AC
  Filled 2017-04-20: qty 0.5

## 2017-04-20 MED ORDER — KETOROLAC TROMETHAMINE 30 MG/ML IJ SOLN
INTRAMUSCULAR | Status: DC | PRN
  Administered 2017-04-20: 30 mg via INTRAVENOUS

## 2017-04-20 MED ORDER — SENNA 8.6 MG PO TABS
1.0000 | ORAL_TABLET | Freq: Two times a day (BID) | ORAL | Status: DC
  Administered 2017-04-20 – 2017-05-08 (×35): 8.6 mg via ORAL
  Filled 2017-04-20 (×35): qty 1

## 2017-04-20 MED ORDER — BUPIVACAINE-EPINEPHRINE (PF) 0.5% -1:200000 IJ SOLN
INTRAMUSCULAR | Status: AC
  Filled 2017-04-20: qty 30

## 2017-04-20 MED ORDER — OXYCODONE HCL 5 MG PO TABS
5.0000 mg | ORAL_TABLET | ORAL | Status: DC | PRN
  Administered 2017-04-20 – 2017-04-30 (×16): 10 mg via ORAL
  Administered 2017-04-30: 5 mg via ORAL
  Administered 2017-04-30: 10 mg via ORAL
  Filled 2017-04-20 (×16): qty 2

## 2017-04-20 MED ORDER — VANCOMYCIN HCL 1000 MG IV SOLR
INTRAVENOUS | Status: DC | PRN
  Administered 2017-04-20: 1000 mg via TOPICAL

## 2017-04-20 MED ORDER — METHOCARBAMOL 750 MG PO TABS
750.0000 mg | ORAL_TABLET | Freq: Four times a day (QID) | ORAL | Status: DC
  Administered 2017-04-20 – 2017-04-30 (×36): 750 mg via ORAL
  Filled 2017-04-20: qty 1
  Filled 2017-04-20: qty 2
  Filled 2017-04-20 (×2): qty 1
  Filled 2017-04-20: qty 2
  Filled 2017-04-20: qty 1
  Filled 2017-04-20: qty 2
  Filled 2017-04-20 (×2): qty 1
  Filled 2017-04-20: qty 2
  Filled 2017-04-20 (×2): qty 1
  Filled 2017-04-20 (×5): qty 2
  Filled 2017-04-20: qty 1
  Filled 2017-04-20 (×5): qty 2
  Filled 2017-04-20: qty 1
  Filled 2017-04-20: qty 2
  Filled 2017-04-20: qty 1
  Filled 2017-04-20: qty 2
  Filled 2017-04-20: qty 1
  Filled 2017-04-20 (×3): qty 2
  Filled 2017-04-20: qty 1
  Filled 2017-04-20: qty 2
  Filled 2017-04-20: qty 1
  Filled 2017-04-20: qty 2
  Filled 2017-04-20: qty 1

## 2017-04-20 MED ORDER — PANTOPRAZOLE SODIUM 40 MG IV SOLR
40.0000 mg | Freq: Every day | INTRAVENOUS | Status: DC
  Administered 2017-04-20: 40 mg via INTRAVENOUS
  Filled 2017-04-20: qty 40

## 2017-04-20 MED ORDER — POTASSIUM CHLORIDE IN NACL 20-0.9 MEQ/L-% IV SOLN
100.0000 mL/h | INTRAVENOUS | Status: DC
  Administered 2017-04-20 – 2017-04-28 (×9): 100 mL/h via INTRAVENOUS
  Filled 2017-04-20 (×14): qty 1000

## 2017-04-20 MED ORDER — BUPIVACAINE LIPOSOME 1.3 % IJ SUSP
20.0000 mL | INTRAMUSCULAR | Status: AC
  Administered 2017-04-20: 20 mL
  Filled 2017-04-20: qty 20

## 2017-04-20 MED ORDER — BUPIVACAINE HCL (PF) 0.5 % IJ SOLN
INTRAMUSCULAR | Status: AC
  Filled 2017-04-20: qty 30

## 2017-04-20 SURGICAL SUPPLY — 75 items
ADH SKN CLS APL DERMABOND .7 (GAUZE/BANDAGES/DRESSINGS)
APL SKNCLS STERI-STRIP NONHPOA (GAUZE/BANDAGES/DRESSINGS)
BAG DECANTER FOR FLEXI CONT (MISCELLANEOUS) ×2 IMPLANT
BENZOIN TINCTURE PRP APPL 2/3 (GAUZE/BANDAGES/DRESSINGS) IMPLANT
BLADE CLIPPER SURG (BLADE) ×1 IMPLANT
BLADE SURG 11 STRL SS (BLADE) ×1 IMPLANT
BUR MATCHSTICK NEURO 3.0 LAGG (BURR) ×2 IMPLANT
BUR ROUND FLUTED 5 RND (BURR) ×2 IMPLANT
CANISTER SUCT 3000ML PPV (MISCELLANEOUS) ×4 IMPLANT
CARTRIDGE OIL MAESTRO DRILL (MISCELLANEOUS) ×1 IMPLANT
CHLORAPREP W/TINT 26ML (MISCELLANEOUS) ×2 IMPLANT
CONT SPEC 4OZ CLIKSEAL STRL BL (MISCELLANEOUS) ×2 IMPLANT
DECANTER SPIKE VIAL GLASS SM (MISCELLANEOUS) ×2 IMPLANT
DERMABOND ADVANCED (GAUZE/BANDAGES/DRESSINGS)
DERMABOND ADVANCED .7 DNX12 (GAUZE/BANDAGES/DRESSINGS) ×1 IMPLANT
DIFFUSER DRILL AIR PNEUMATIC (MISCELLANEOUS) ×2 IMPLANT
DRAPE MICROSCOPE LEICA (MISCELLANEOUS) ×2 IMPLANT
DRAPE POUCH INSTRU U-SHP 10X18 (DRAPES) ×2 IMPLANT
DRAPE SURG 17X23 STRL (DRAPES) ×2 IMPLANT
DRSG OPSITE POSTOP 4X6 (GAUZE/BANDAGES/DRESSINGS) ×1 IMPLANT
ELECT REM PT RETURN 9FT ADLT (ELECTROSURGICAL) ×2
ELECTRODE REM PT RTRN 9FT ADLT (ELECTROSURGICAL) ×1 IMPLANT
GAUZE SPONGE 4X4 12PLY STRL (GAUZE/BANDAGES/DRESSINGS) IMPLANT
GAUZE SPONGE 4X4 16PLY XRAY LF (GAUZE/BANDAGES/DRESSINGS) IMPLANT
GLOVE BIO SURGEON STRL SZ7 (GLOVE) ×1 IMPLANT
GLOVE BIOGEL PI IND STRL 7.0 (GLOVE) IMPLANT
GLOVE BIOGEL PI IND STRL 7.5 (GLOVE) ×1 IMPLANT
GLOVE BIOGEL PI IND STRL 8 (GLOVE) IMPLANT
GLOVE BIOGEL PI INDICATOR 7.0 (GLOVE) ×1
GLOVE BIOGEL PI INDICATOR 7.5 (GLOVE) ×1
GLOVE BIOGEL PI INDICATOR 8 (GLOVE) ×2
GLOVE ECLIPSE 7.5 STRL STRAW (GLOVE) ×3 IMPLANT
GLOVE SS BIOGEL STRL SZ 7.5 (GLOVE) ×2 IMPLANT
GLOVE SUPERSENSE BIOGEL SZ 7.5 (GLOVE) ×2
GOWN STRL REUS W/ TWL LRG LVL3 (GOWN DISPOSABLE) ×1 IMPLANT
GOWN STRL REUS W/ TWL XL LVL3 (GOWN DISPOSABLE) IMPLANT
GOWN STRL REUS W/TWL 2XL LVL3 (GOWN DISPOSABLE) ×1 IMPLANT
GOWN STRL REUS W/TWL LRG LVL3 (GOWN DISPOSABLE) ×4
GOWN STRL REUS W/TWL XL LVL3 (GOWN DISPOSABLE)
HEMOSTAT POWDER KIT SURGIFOAM (HEMOSTASIS) ×2 IMPLANT
KIT BASIN OR (CUSTOM PROCEDURE TRAY) ×2 IMPLANT
KIT ROOM TURNOVER OR (KITS) ×2 IMPLANT
NDL HYPO 18GX1.5 BLUNT FILL (NEEDLE) ×1 IMPLANT
NDL HYPO 21X1.5 SAFETY (NEEDLE) ×2 IMPLANT
NEEDLE HYPO 18GX1.5 BLUNT FILL (NEEDLE) ×4 IMPLANT
NEEDLE HYPO 21X1.5 SAFETY (NEEDLE) ×4 IMPLANT
NS IRRIG 1000ML POUR BTL (IV SOLUTION) ×2 IMPLANT
OIL CARTRIDGE MAESTRO DRILL (MISCELLANEOUS) ×2
PACK LAMINECTOMY NEURO (CUSTOM PROCEDURE TRAY) ×2 IMPLANT
PACK UNIVERSAL I (CUSTOM PROCEDURE TRAY) ×2 IMPLANT
PAD ARMBOARD 7.5X6 YLW CONV (MISCELLANEOUS) ×6 IMPLANT
PATTIES SURGICAL .5 X.5 (GAUZE/BANDAGES/DRESSINGS) ×1 IMPLANT
PATTIES SURGICAL .5X1.5 (GAUZE/BANDAGES/DRESSINGS) ×1 IMPLANT
RUBBERBAND STERILE (MISCELLANEOUS) ×4 IMPLANT
SEALANT ADHERUS EXTEND TIP (MISCELLANEOUS) ×1 IMPLANT
SPONGE NEURO XRAY DETECT 1X3 (DISPOSABLE) ×1 IMPLANT
SPONGE SURGIFOAM ABS GEL SZ50 (HEMOSTASIS) ×2 IMPLANT
STRIP CLOSURE SKIN 1/2X4 (GAUZE/BANDAGES/DRESSINGS) IMPLANT
SUT ETHILON 2 0 FS 18 (SUTURE) ×1 IMPLANT
SUT ETHILON 3 0 FSL (SUTURE) IMPLANT
SUT PROLENE 6 0 BV (SUTURE) ×4 IMPLANT
SUT STRATAFIX MNCRL+ 3-0 PS-2 (SUTURE)
SUT STRATAFIX MONOCRYL 3-0 (SUTURE)
SUT VIC AB 0 CT1 18XCR BRD8 (SUTURE) ×1 IMPLANT
SUT VIC AB 0 CT1 8-18 (SUTURE) ×4
SUT VIC AB 2-0 CT1 18 (SUTURE) ×3 IMPLANT
SUT VIC AB 4-0 PS2 27 (SUTURE) IMPLANT
SUTURE STRATFX MNCRL+ 3-0 PS-2 (SUTURE) IMPLANT
SYR 20CC LL (SYRINGE) ×1 IMPLANT
SYR 30ML LL (SYRINGE) ×3 IMPLANT
SYR 5ML LL (SYRINGE) ×2 IMPLANT
TOWEL GREEN STERILE (TOWEL DISPOSABLE) ×2 IMPLANT
TOWEL GREEN STERILE FF (TOWEL DISPOSABLE) ×2 IMPLANT
TUBE CONNECTING 12X1/4 (SUCTIONS) ×1 IMPLANT
WATER STERILE IRR 1000ML POUR (IV SOLUTION) ×2 IMPLANT

## 2017-04-20 NOTE — Transfer of Care (Signed)
Immediate Anesthesia Transfer of Care Note  Patient: Wendee BeaversMiguel Vega  Procedure(s) Performed: Procedure(s): Re Operative Right Lumbar five-Sacral one Laminectomy/Recurrent discectomy, Left Lumbar four-five Laminectomy for decompression, pseudomeningocele repair (N/A)  Patient Location: PACU  Anesthesia Type:General  Level of Consciousness: awake, alert , oriented and patient cooperative  Airway & Oxygen Therapy: Patient Spontanous Breathing and Patient connected to face mask oxygen  Post-op Assessment: Report given to RN, Post -op Vital signs reviewed and stable and Patient moving all extremities X 4  Post vital signs: Reviewed and stable  Last Vitals:  Vitals:   04/20/17 1303  BP: 114/73  Pulse: 73  Resp: 18  Temp: 36.9 C    Last Pain:  Vitals:   04/20/17 1303  TempSrc: Oral  PainSc: 10-Worst pain ever      Patients Stated Pain Goal: 5 (04/20/17 1303)  Complications: No apparent anesthesia complications

## 2017-04-20 NOTE — Brief Op Note (Signed)
04/20/2017  4:53 PM  PATIENT:  Sergio Boyd  46 y.o. male  PRE-OPERATIVE DIAGNOSIS:  Lumbar Herniated disc, Possible pseudomeningocele  POST-OPERATIVE DIAGNOSIS:  Lumbar Herniated disc, pseudomeningocele  PROCEDURE:  Procedure(s): Re Operative Right Lumbar five-Sacral one Laminectomy/Recurrent discectomy, Left Lumbar four-five Laminectomy for decompression, pseudomeningocele repair (N/A)  SURGEON:  Surgeon(s) and Role:    * Ditty, Loura HaltBenjamin Jared, MD - Primary  PHYSICIAN ASSISTANT: Cindra PresumeVincent Costella, PA-C  ANESTHESIA:   general  EBL:  Total I/O In: 1200 [I.V.:1200] Out: 50 [Blood:50]  BLOOD ADMINISTERED:none  DRAINS: none   LOCAL MEDICATIONS USED:  MARCAINE    and LIDOCAINE   SPECIMEN:  No Specimen  DISPOSITION OF SPECIMEN:  N/A  COUNTS:  YES  TOURNIQUET:  * No tourniquets in log *  DICTATION: .Note written in EPIC  PLAN OF CARE: Admit to inpatient   PATIENT DISPOSITION:  PACU - hemodynamically stable.   Delay start of Pharmacological VTE agent (>24hrs) due to surgical blood loss or risk of bleeding: yes

## 2017-04-20 NOTE — Anesthesia Preprocedure Evaluation (Addendum)
Anesthesia Evaluation  Patient identified by MRN, date of birth, ID band Patient awake    Reviewed: Allergy & Precautions, Patient's Chart, lab work & pertinent test results  Airway Mallampati: I  TM Distance: >3 FB Neck ROM: Full    Dental  (+) Teeth Intact, Dental Advisory Given   Pulmonary Current Smoker,    Pulmonary exam normal        Cardiovascular Normal cardiovascular exam     Neuro/Psych  Headaches,    GI/Hepatic GERD  Medicated and Controlled,  Endo/Other    Renal/GU      Musculoskeletal  (+) Arthritis , Osteoarthritis,    Abdominal   Peds  Hematology   Anesthesia Other Findings   Reproductive/Obstetrics                            Anesthesia Physical Anesthesia Plan  ASA: II  Anesthesia Plan: General   Post-op Pain Management:    Induction: Intravenous  Airway Management Planned: Oral ETT  Additional Equipment:   Intra-op Plan:   Post-operative Plan: Extubation in OR  Informed Consent: I have reviewed the patients History and Physical, chart, labs and discussed the procedure including the risks, benefits and alternatives for the proposed anesthesia with the patient or authorized representative who has indicated his/her understanding and acceptance.   Dental advisory given  Plan Discussed with: CRNA, Surgeon and Anesthesiologist  Anesthesia Plan Comments:        Anesthesia Quick Evaluation

## 2017-04-20 NOTE — H&P (Deleted)
CC:  No chief complaint on file.   HPI: Sergio Boyd is a 46 y.o. male with chronic lumbar radiculopathy. He recently underwent right laminectomy 03/08/2017. After surgery, he continued to have a significant amount of lumbar back pain with radicular symptoms, as well as a new fluctuant mass at the surgical site consistent with a pseudomeningocele. A repeat MRI was ordered which shows a subcutaneous pseudomeningocele. His right sided cranially extended disc herniation is absent but it does appear there is a new caudally displaced fragment on the right that is most likely the cause of his pain. He also has some spurring on the left which is causing lateral stenosis. I have recommended a reoperative right laminectomy as well as left sided laminectomy for decompression with a side sided discectomy and possible pseudomeningocele repair. He is present today for surgery. He is without any concerns.   PMH: Past Medical History:  Diagnosis Date  . Arthritis   . Chronic back pain    herniated disc  . GERD (gastroesophageal reflux disease)   . Headache   . History of migraine   . Joint pain     PSH: Past Surgical History:  Procedure Laterality Date  . INGUINAL HERNIA REPAIR Right   . INGUINAL HERNIA REPAIR Right 11/23/2015   Procedure: RIGHT INGUINAL HERNIA REPAIR W/MESH;  Surgeon: Avel Peace, MD;  Location: [facility] OR;  Service: General;  Laterality: Right;  . INSERTION OF MESH Right 11/23/2015   Procedure: INSERTION OF MESH;  Surgeon: Avel Peace, MD;  Location: [facility] OR;  Service: General;  Laterality: Right;  . KNEE SURGERY     right ACL 2012  . LUMBAR SPINE SURGERY  02/2017   Dr. Bevely Palmer    SH: Social History  Substance Use Topics  . Smoking status: Current Every Day Smoker    Packs/day: 0.10    Years: 10.00  . Smokeless tobacco: Never Used  . Alcohol use No    MEDS: Prior to Admission medications   Medication Sig Start Date End Date Taking? Authorizing Provider  acetaZOLAMIDE  (DIAMOX) 250 MG tablet Take 250 mg by mouth 3 (three) times daily. 04/18/17  Yes [provider]  docusate sodium (COLACE) 100 MG capsule Take 1 capsule (100 mg total) by mouth 2 (two) times daily. 04/17/17  Yes Tysinger, Kermit Balo, PA-C  methylPREDNISolone (MEDROL DOSEPAK) 4 MG TBPK tablet as directed. 04/17/17  Yes [provider]  MOVANTIK 25 MG TABS tablet Take 25 mg by mouth daily. 04/14/17  Yes [provider]  oxyCODONE (OXY IR/ROXICODONE) 5 MG immediate release tablet Take 1-2 tablets (5-10 mg total) by mouth every 6 (six) hours as needed for moderate pain, severe pain or breakthrough pain. 02/27/17  Yes Tysinger, Kermit Balo, PA-C  oxyCODONE-acetaminophen (PERCOCET/ROXICET) 5-325 MG tablet Take 1 tablet by mouth every 6 (six) hours as needed for pain. 04/09/17  Yes [provider]  psyllium (METAMUCIL SMOOTH TEXTURE) 28 % packet Take 1 packet by mouth 2 (two) times daily. 04/14/17  Yes Barrett Henle, PA-C  ALPRAZolam Prudy Feeler) 0.5 MG tablet Take 0.5 mg by mouth once. Prior to MRI 04/18/17   [provider]  dibucaine (NUPERCAINAL) 1 % OINT Place 1 application rectally 3 (three) times daily as needed for pain. 04/14/17   Barrett Henle, PA-C  hydrocortisone (ANUSOL-HC) 2.5 % rectal cream Place 1 application rectally 2 (two) times daily. 04/17/17   Tysinger, Kermit Balo, PA-C  hydrocortisone (ANUSOL-HC) 25 MG suppository Place 1 suppository (25 mg total) rectally 2 (  two) times daily. 04/17/17   Tysinger, Kermit Baloavid S, PA-C    ALLERGY: No Active Allergies  ROS: Negative with exception of back pain with radicular symptoms ROS  Vitals:   04/20/17 1303  BP: 114/73  Pulse: 73  Resp: 18  Temp: 98.5 F (36.9 C)   General appearance: WDWN, NAD Eyes: PERRL, Fundoscopic: normal Cardiovascular: Regular rate and rhythm without murmurs, rubs, gallops. No edema or variciosities. Distal pulses normal. Pulmonary: Clear to auscultation Musculoskeletal:     Muscle  tone upper extremities: Normal    Muscle tone lower extremities: Normal    Motor exam: Upper Extremities Deltoid Bicep Tricep Grip  Right 5/5 5/5 5/5 5/5  Left 5/5 5/5 5/5 5/5   Lower Extremity IP Quad PF DF EHL  Right 5/5 5/5 5/5 5/5 5/5  Left 5/5 5/5 5/5 5/5 5/5   Neurological Awake, alert, oriented Memory and concentration grossly intact Speech fluent, appropriate CNII: Visual fields normal CNIII/IV/VI: EOMI CNV: Facial sensation normal CNVII: Symmetric, normal strength CNVIII: Grossly normal CNIX: Normal palate movement CNXI: Trap and SCM strength normal CN XII: Tongue protrusion normal Sensation grossly intact to LT DTR: Normal Coordination (finger/nose & heel/shin): Normal  IMAGING: No new imaging  IMPRESSION/PLAN - 46 y.o. male with recurrent disc herniation and possible pseudomeningocele.  I have recommended a reoperative right laminectomy as well as left sided laminectomy for decompression with a side sided discectomy and possible pseudomeningocele repair. We have a long discussion, in language he understands, about tthe details of the procedure including its risks/benefits. We also discussed alternatives to the procedure and the risks and benefits of those. He asked appropriate questions and wishes to proceed with surgery.

## 2017-04-20 NOTE — Anesthesia Postprocedure Evaluation (Signed)
Anesthesia Post Note  Patient: Sergio Boyd  Procedure(s) Performed: Procedure(s) (LRB): Re Operative Right Lumbar five-Sacral one Laminectomy/Recurrent discectomy, Left Lumbar four-five Laminectomy for decompression, pseudomeningocele repair (N/A)  Patient location during evaluation: PACU Anesthesia Type: General Level of consciousness: awake and alert Pain management: pain level controlled Vital Signs Assessment: post-procedure vital signs reviewed and stable Respiratory status: spontaneous breathing, nonlabored ventilation, respiratory function stable and patient connected to nasal cannula oxygen Cardiovascular status: blood pressure returned to baseline and stable Postop Assessment: no signs of nausea or vomiting Anesthetic complications: no       Last Vitals:  Vitals:   04/20/17 1716 04/20/17 1730  BP: 139/90 125/81  Pulse: 63 (!) 50  Resp: 11 11  Temp:      Last Pain:  Vitals:   04/20/17 1730  TempSrc:   PainSc: 10-Worst pain ever                 Izadora Roehr DAVID

## 2017-04-20 NOTE — Progress Notes (Signed)
Per Cindra PresumeVincent Costella, NP 1 mg IV dilaudid q 2 hr PRN for breakthrough pain, 10 mg flexeril PO TID PRN, d/c valium and scheduled tylenol. RN will continue to monitor patient.

## 2017-04-20 NOTE — Progress Notes (Signed)
Patient 10/10 stabbing. Patient states tylenol make his throat close up. Patient states valium is not effect for him. Patient repositioned. Patient incision  RN paged MD. RN will continue to monitor.

## 2017-04-20 NOTE — Anesthesia Procedure Notes (Signed)
Procedure Name: Intubation Date/Time: 04/20/2017 2:06 PM Performed by: Carney Living Pre-anesthesia Checklist: Patient identified, Emergency Drugs available, Suction available, Patient being monitored and Timeout performed Patient Re-evaluated:Patient Re-evaluated prior to inductionOxygen Delivery Method: Circle system utilized Preoxygenation: Pre-oxygenation with 100% oxygen Intubation Type: IV induction Ventilation: Mask ventilation without difficulty Laryngoscope Size: Mac and 4 Grade View: Grade I Tube type: Oral Tube size: 7.5 mm Number of attempts: 1 Airway Equipment and Method: Stylet Placement Confirmation: positive ETCO2,  ETT inserted through vocal cords under direct vision and breath sounds checked- equal and bilateral Secured at: 22 cm Tube secured with: Tape Dental Injury: Teeth and Oropharynx as per pre-operative assessment

## 2017-04-20 NOTE — Progress Notes (Signed)
Patient arrived to unit.  Transferred from stretcher to bed.  Patient alert, verbal.  HOB flat for required flat bedrest.  Vital signs obtained.  Dressing to lower back, honeycomb, clean, dry and intact.

## 2017-04-20 NOTE — H&P (Signed)
CC:  No chief complaint on file. Headaches, back pain and swelling, leg pain  HPI: Erica Osuna is a 46 y.o. male with a delayed pseudomeningocele, recurrent disc herniation and radiculopathy.  He presents for wound exploration, further decompression, and pseudomeningocele repair.  PMH: Past Medical History:  Diagnosis Date  . Arthritis   . Chronic back pain    herniated disc  . GERD (gastroesophageal reflux disease)   . Headache   . History of migraine   . Joint pain     PSH: Past Surgical History:  Procedure Laterality Date  . INGUINAL HERNIA REPAIR Right   . INGUINAL HERNIA REPAIR Right 11/23/2015   Procedure: RIGHT INGUINAL HERNIA REPAIR W/MESH;  Surgeon: Avel Peace, MD;  Location: [facility] OR;  Service: General;  Laterality: Right;  . INSERTION OF MESH Right 11/23/2015   Procedure: INSERTION OF MESH;  Surgeon: Avel Peace, MD;  Location: [facility] OR;  Service: General;  Laterality: Right;  . KNEE SURGERY     right ACL 2012  . LUMBAR SPINE SURGERY  02/2017   Dr. Bevely Palmer    SH: Social History  Substance Use Topics  . Smoking status: Current Every Day Smoker    Packs/day: 0.10    Years: 10.00  . Smokeless tobacco: Never Used  . Alcohol use No    MEDS: Prior to Admission medications   Medication Sig Start Date End Date Taking? Authorizing Provider  acetaZOLAMIDE (DIAMOX) 250 MG tablet Take 250 mg by mouth 3 (three) times daily. 04/18/17  Yes [provider]  docusate sodium (COLACE) 100 MG capsule Take 1 capsule (100 mg total) by mouth 2 (two) times daily. 04/17/17  Yes Tysinger, Kermit Balo, PA-C  methylPREDNISolone (MEDROL DOSEPAK) 4 MG TBPK tablet as directed. 04/17/17  Yes [provider]  MOVANTIK 25 MG TABS tablet Take 25 mg by mouth daily. 04/14/17  Yes [provider]  oxyCODONE (OXY IR/ROXICODONE) 5 MG immediate release tablet Take 1-2 tablets (5-10 mg total) by mouth every 6 (six) hours as needed for moderate pain, severe pain or breakthrough pain.  02/27/17  Yes Tysinger, Kermit Balo, PA-C  oxyCODONE-acetaminophen (PERCOCET/ROXICET) 5-325 MG tablet Take 1 tablet by mouth every 6 (six) hours as needed for pain. 04/09/17  Yes [provider]  psyllium (METAMUCIL SMOOTH TEXTURE) 28 % packet Take 1 packet by mouth 2 (two) times daily. 04/14/17  Yes Barrett Henle, PA-C  ALPRAZolam Prudy Feeler) 0.5 MG tablet Take 0.5 mg by mouth once. Prior to MRI 04/18/17   [provider]  dibucaine (NUPERCAINAL) 1 % OINT Place 1 application rectally 3 (three) times daily as needed for pain. 04/14/17   Barrett Henle, PA-C  hydrocortisone (ANUSOL-HC) 2.5 % rectal cream Place 1 application rectally 2 (two) times daily. 04/17/17   Tysinger, Kermit Balo, PA-C  hydrocortisone (ANUSOL-HC) 25 MG suppository Place 1 suppository (25 mg total) rectally 2 (two) times daily. 04/17/17   Tysinger, Kermit Balo, PA-C    ALLERGY: No Active Allergies  ROS: ROS  NEUROLOGIC EXAM: Awake, alert, oriented Memory and concentration grossly intact Speech fluent, appropriate CN grossly intact Motor exam: Upper Extremities Deltoid Bicep Tricep Grip  Right 5/5 5/5 5/5 5/5  Left 5/5 5/5 5/5 5/5   Lower Extremity IP Quad PF DF EHL  Right 5/5 5/5 5/5 5/5 5/5  Left 5/5 5/5 5/5 5/5 5/5   Sensation grossly intact to LT  IMPRESSION: - 46 y.o. male with lumbosacral spondylosis and recurrent disc herniation with radiculopathy as well as delayed  pseudomeningocele.  PLAN: - L5-S1 decompression bilaterally, discectomy, pseudomeningocele repair - We have discussed risks, benefits, and alternatives to surgery and he wishes to proceed.

## 2017-04-21 ENCOUNTER — Encounter (HOSPITAL_COMMUNITY): Payer: Self-pay

## 2017-04-21 LAB — GLUCOSE, CAPILLARY: GLUCOSE-CAPILLARY: 112 mg/dL — AB (ref 65–99)

## 2017-04-21 MED ORDER — OXYCODONE HCL ER 20 MG PO T12A
20.0000 mg | EXTENDED_RELEASE_TABLET | Freq: Two times a day (BID) | ORAL | Status: DC
  Administered 2017-04-21: 10 mg via ORAL
  Administered 2017-04-21 – 2017-04-30 (×17): 20 mg via ORAL
  Filled 2017-04-21 (×18): qty 1

## 2017-04-21 MED ORDER — PANTOPRAZOLE SODIUM 40 MG PO TBEC
40.0000 mg | DELAYED_RELEASE_TABLET | Freq: Every day | ORAL | Status: DC
  Administered 2017-04-21 – 2017-04-25 (×4): 40 mg via ORAL
  Filled 2017-04-21 (×4): qty 1

## 2017-04-21 NOTE — Progress Notes (Signed)
Pt seen and examined. Issues with pain control in the middle of the night. Added Dilaudid for breakthrough pain Otherwise without complains  EXAM: Temp:  [97.6 F (36.4 C)-98.5 F (36.9 C)] 97.9 F (36.6 C) (05/12 0600) Pulse Rate:  [50-85] 54 (05/12 0600) Resp:  [10-20] 12 (05/12 0600) BP: (114-155)/(63-97) 117/64 (05/12 0600) SpO2:  [98 %-100 %] 98 % (05/12 0600) Weight:  [90.7 kg (200 lb)] 90.7 kg (200 lb) (05/11 1303) Intake/Output      05/11 0701 - 05/12 0700 05/12 0701 - 05/13 0700   P.O. 390    I.V. (mL/kg) 2478 (27.3)    IV Piggyback 50    Total Intake(mL/kg) 2918 (32.2)    Urine (mL/kg/hr) 750    Blood 50    Total Output 800     Net +2118           Awake and alert Follows commands throughout MAEW Wound c/d/i  Stable Will work on po medications vs dilaudid due to issues with constipation. Increase OxyContin to 20mg  BID from 10mg  BID Otherwise, continue current care

## 2017-04-21 NOTE — Op Note (Signed)
04/20/2017  8:28 AM  PATIENT:  Sergio Boyd  46 y.o. male  PRE-OPERATIVE DIAGNOSIS:  Lumbosacral spondylosis with radiculopathy; recurrent disc herniation right L5-S1; pseudomeningocele  POST-OPERATIVE DIAGNOSIS:  Same  PROCEDURE:  Re-operative laminectomy right L5-S1, left L5-S1 laminectomy for decompression; repair of pseudomeningocele; microsurgical technique requiring use of operating microscope.  SURGEON:  Hulan SaasBenjamin J. Precious Segall, MD  ASSISTANTS: Cindra PresumeVincent Costella, PA-C  ANESTHESIA:   General  DRAINS: None   SPECIMEN:  None  INDICATION FOR PROCEDURE: 46 year old male with recurrent lumbar radiculopathy and delayed onset of pseudomeningocele.  We had discussed re-operation for decompression at the previously operated location, right L5-S1, as well as decompression on the left at that level and wound exploration with possible pseudomeningocele repair.  Patient understood the risks, benefits, and alternatives and potential outcomes and wished to proceed.  PROCEDURE DETAILS: After smooth induction of general endotracheal anesthesia the patient was positioned prone on a Wilson frame and the skin of the low back was shaved and wiped down with alcohol.  The existing incision was infiltrated with lidocaine and marcaine with epinephrine.  The patient was prepped and draped in the usual sterile fashion.  The scar was excised.  Clear spinal fluid was encountered after penetrating the subcutaneous fat.  This was suction aspirated away.  Subperiosteal dissection was carried out on the left at L5-S1.  The pseudomeningocele had done much of the dissection on the right.  The subperiosteal dissection was carried out slightly further cranially and laterally on the right.  The L5 spinous process was resected with a Leksell rongeur.  The left inferior L5 and superior S1 lamina were thinned with a high speed bur.  The ligament was separated from the bone with a curette and resected with a Kerrison rongeur.  I  elevated the ligamentum flavum and separated it from the dura then resected it with a Kerrison punch.  I identified the S1 nerve root and ensured it was free of compression.  I brought in the microscope to provide light and magnification.  I gently separated the dura from the cut bone edge at L5-S1 on the right.  I identified three areas of dural attenuation with slow CSF leak, all adjacent to bone edges.  Two were on the dorsal surface of the dura and one was on the dorsolateral aspect of the S1 nerve root.  I used 6-0 prolene suture with a figure of 8 stitch to close one of the dorsal defects.  I used several running sutures to close the other dorsal defect.  The defect on the nerve root was very thin and friable.  I took one large but superficial pass with the stitch and placed a fat graft on top which was tied down with the stitch.  After valsalva all of these remained dry.  I gently dissected the right S1 nerve root free from the ventral scar.  I identified the disc space and removed a small amount of disc material but mostly encountered scar.  I was concerned that further attempts to take out disc would damage the nerve root and so I chose to decompress the nerve root dorsally.  I freed the nerve from the remaining S1 lamina above it and resected the bone back until I was satisfied the nerve was decompressed.  I confirmed this with the ability to pass a coronary dilator along the path of the nerve without resistance.  I obtained hemostasis and then irrigated with bacitracin saline.  Valsalva was performed again without CSF egress.  Adherus was placed over repaired durotomies.  Depo-medrol was inserted into the depths of the wound as was some vancomycin powder.  The wound was then closed in routine anatomic layers with interrupted vicryl sutures.  The skin was closed with a running, locked nylon suture.  The patient awoke from anesthesia without complication.  After the procedure we realized that  the consent was mislabeled and stated that the intended level was L4-5.  In counseling the patient before surgery we reviewed his images and he and I both understood the goal was to address the site of his original surgery, right L5-S1, and the opposite side, left L5-S1.  This is the procedure that was performed.  PATIENT DISPOSITION:  PACU - hemodynamically stable.   Delay start of Pharmacological VTE agent (>24hrs) due to surgical blood loss or risk of bleeding:  yes

## 2017-04-21 NOTE — Progress Notes (Signed)
 20mg  oxycontin split to give additional 10mg  oxycontin for total of 20mg  to patient.  Wasted in sharps, witnessed by Dynegyngela RN.

## 2017-04-22 NOTE — Progress Notes (Signed)
Seen and examined Doing better Plan to elevate head of bed slowly at 1500 today Likely home tomorrow

## 2017-04-22 NOTE — Progress Notes (Signed)
Pt seen and examined.  No issues overnight. Radicular symptoms have resolved. Pain at site of incision, but fairly manageable  EXAM: Temp:  [97.5 F (36.4 C)-98.5 F (36.9 C)] 97.8 F (36.6 C) (05/13 0601) Pulse Rate:  [50-63] 58 (05/13 0601) Resp:  [15-16] 16 (05/13 0601) BP: (107-135)/(54-72) 110/72 (05/13 0601) SpO2:  [98 %-100 %] 100 % (05/13 0601) Intake/Output      05/12 0701 - 05/13 0700 05/13 0701 - 05/14 0700   P.O.     I.V. (mL/kg)     IV Piggyback     Total Intake(mL/kg)     Urine (mL/kg/hr) 250 (0.1)    Blood     Total Output 250     Net -250           Awake and alert Follows commands throughout MAEW Wound flat, c/d/i  Stable. Continue current care Elevated head of bed at 1500

## 2017-04-23 NOTE — Progress Notes (Signed)
Pt seen and examined. No issues overnight.  Some headache with eye motion, not positional.  Has not ambulated.  EXAM: Temp:  [97.5 F (36.4 C)-98 F (36.7 C)] 97.5 F (36.4 C) (05/14 0506) Pulse Rate:  [50-59] 50 (05/14 0506) Resp:  [17-18] 17 (05/14 0506) BP: (110-131)/(58-72) 121/72 (05/14 0506) SpO2:  [99 %-100 %] 100 % (05/14 0506) Intake/Output      05/13 0701 - 05/14 0700 05/14 0701 - 05/15 0700   P.O. 1080    I.V. (mL/kg) 3 (0)    Total Intake(mL/kg) 1083 (11.9)    Urine (mL/kg/hr) 2710 (1.2)    Total Output 2710     Net -1627           Awake and alert Follows commands throughout Full strength Incision c/d/i, flat  Stable Encourage ambulation Bowel stim PT Hopefully home tomorrow

## 2017-04-23 NOTE — Progress Notes (Signed)
Patient is A&O X4. Patient's pain is managed at the end of shift. Will continue to monitor

## 2017-04-23 NOTE — Evaluation (Signed)
Physical Therapy Evaluation Patient Details Name: Sergio Boyd MRN: [id number] DOB: 1971/06/24 Today's Date: 04/23/2017   History of Present Illness  patient is a 46 yo male s/p Re-operative laminectomy right L5-S1, left L5-S1 laminectomy for decompression; repair of pseudomeningocele  Clinical Impression  Patient seen for mobility assessment s/p spinal surgery. Mobilizing with some modest instability. Will benefit from continued skilled PT to address mobility deficits and educate on mobility expectations and precautions. Patient appreciative and receptive. Will see as indicated and progress as tolerated.  OF NOTE: patient denies increase in headache with movement     Follow Up Recommendations No PT follow up    Equipment Recommendations  None recommended by PT    Recommendations for Other Services       Precautions / Restrictions Precautions Precautions: Back Precaution Comments: verbally reviewed, will provide hand out       Mobility  Bed Mobility Overal bed mobility: Needs Assistance Bed Mobility: Sidelying to Sit   Sidelying to sit: Supervision       General bed mobility comments: Vcs for positioning, increased time and effort to perform.   Transfers Overall transfer level: Needs assistance Equipment used: None Transfers: Sit to/from Stand Sit to Stand: Supervision         General transfer comment: supervision for safety, some instability noted, increased time to elevate to standing. No physical assist required  Ambulation/Gait Ambulation/Gait assistance: Supervision Ambulation Distance (Feet): 140 Feet Assistive device: None Gait Pattern/deviations: Step-through pattern;Decreased stride length;Drifts right/left Gait velocity: decreased   General Gait Details: Vcs for increased cadence, modest instability, no physical assist required at this time.  Stairs            Wheelchair Mobility    Modified Rankin (Stroke Patients Only)       Balance  Overall balance assessment: Needs assistance   Sitting balance-Leahy Scale: Good       Standing balance-Leahy Scale: Fair Standing balance comment: some instability noted, increased sway, no physical assist required             High level balance activites: Direction changes;Turns;Head turns High Level Balance Comments: modest instability             Pertinent Vitals/Pain Pain Assessment: 0-10 Pain Score: 7  Pain Location: right side low back and hip/ headache Pain Descriptors / Indicators: Headache;Aching;Sore Pain Intervention(s): Monitored during session    Home Living Family/patient expects to be discharged to:: Private residence Living Arrangements: Alone   Type of Home: House Home Access: Stairs to enter Entrance Stairs-Rails: Can reach both Entrance Stairs-Number of Steps: 7 Home Layout: One level Home Equipment: None      Prior Function Level of Independence: Independent               Hand Dominance   Dominant Hand: Right    Extremity/Trunk Assessment   Upper Extremity Assessment Upper Extremity Assessment: Overall WFL for tasks assessed    Lower Extremity Assessment Lower Extremity Assessment: Overall WFL for tasks assessed       Communication   Communication: No difficulties  Cognition Arousal/Alertness: Awake/alert Behavior During Therapy: WFL for tasks assessed/performed Overall Cognitive Status: Within Functional Limits for tasks assessed                                        General Comments      Exercises     Assessment/Plan  PT Assessment Patient needs continued PT services  PT Problem List Decreased activity tolerance;Decreased balance;Decreased mobility;Decreased knowledge of precautions       PT Treatment Interventions DME instruction;Gait training;Stair training;Functional mobility training;Therapeutic activities;Therapeutic exercise;Balance training;Patient/family education    PT Goals  (Current goals can be found in the Care Plan section)  Acute Rehab PT Goals Patient Stated Goal: to go home PT Goal Formulation: With patient Time For Goal Achievement: 05/07/17 Potential to Achieve Goals: Good    Frequency Min 5X/week   Barriers to discharge        Co-evaluation               AM-PAC PT "6 Clicks" Daily Activity  Outcome Measure Difficulty turning over in bed (including adjusting bedclothes, sheets and blankets)?: A Little Difficulty moving from lying on back to sitting on the side of the bed? : A Little Difficulty sitting down on and standing up from a chair with arms (e.g., wheelchair, bedside commode, etc,.)?: A Little Help needed moving to and from a bed to chair (including a wheelchair)?: A Little Help needed walking in hospital room?: A Little Help needed climbing 3-5 steps with a railing? : A Little 6 Click Score: 18    End of Session   Activity Tolerance: No increased pain Patient left: in chair;with call bell/phone within reach Nurse Communication: Mobility status PT Visit Diagnosis: Unsteadiness on feet (R26.81)    Time: 1610-9604 PT Time Calculation (min) (ACUTE ONLY): 19 min   Charges:   PT Evaluation $PT Eval Moderate Complexity: 1 Procedure     PT G Codes:        Charlotte Crumb, PT DPT  [phone]   Fabio Asa 04/23/2017, 9:34 AM

## 2017-04-23 NOTE — Progress Notes (Signed)
Stopped by to visit w/ pt, but he was on the phone. Will try back another time.   04/23/17 1300  Clinical Encounter Type  Visited With Patient not available  Visit Type Initial;Psychological support;Spiritual support;Social support  Referral From Chaplain   Ephraim Hamburgerynthia A Gerldine Suleiman, [address]

## 2017-04-23 NOTE — Progress Notes (Signed)
Occupational Therapy Evaluation Patient Details Name: Sergio BeaversMiguel Boyd MRN: [id number] DOB: Aug 04, 1971 Today's Date: 04/23/2017    History of Present Illness patient is a [age] yo male s/p Re-operative laminectomy right L5-S1, left L5-S1 laminectomy for decompression; repair of pseudomeningocele   Clinical Impression   PTA, pt independent with ADL and mobility. Pt currently requires S with mobility and min A for LB ADL. Began education on compensatory techniques for ADL and functional mobility. Will follow acutely to maximize functional level of independence to facilitate safe DC home with intermittent S.     Follow Up Recommendations  No OT follow up;Supervision - Intermittent    Equipment Recommendations  3 in 1 bedside commode (to use as shower chair)   Recommendations for Other Services       Precautions / Restrictions Precautions Precautions: Back Precaution Booklet Issued: Yes (comment) Precaution Comments: verbally reviewed      Mobility Bed Mobility Overal bed mobility: Needs Assistance             General bed mobility comments: reviewed technique  Transfers Overall transfer level: Needs assistance Equipment used: None Transfers: Sit to/from Stand Sit to Stand: Supervision         General transfer comment: supervision for safety, some instability noted, increased time to elevate to standing. No physical assist required    Balance Overall balance assessment: Needs assistance   Sitting balance-Leahy Scale: Good       Standing balance-Leahy Scale: Fair                             ADL either performed or assessed with clinical judgement   ADL Overall ADL's : Needs assistance/impaired     Grooming: Set up;Supervision/safety;Standing   Upper Body Bathing: Set up;Sitting   Lower Body Bathing: Minimal assistance;Sit to/from stand   Upper Body Dressing : Set up;Supervision/safety;Sitting   Lower Body Dressing: Minimal assistance;Sit to/from  stand   Toilet Transfer: Min guard;Ambulation   Toileting- Clothing Manipulation and Hygiene: Supervision/safety       Functional mobility during ADLs: Min guard General ADL Comments: Pt expressed conern over doing LB ADL. May benefi tform AE. Began education on compensatory technqiues. Pt has difficultycrossing R leg over knee.      Vision         Perception     Praxis      Pertinent Vitals/Pain Pain Assessment: 0-10 Pain Score: 7  Pain Location: right side low back  Pain Descriptors / Indicators: Headache;Aching;Sore Pain Intervention(s): Limited activity within patient's tolerance     Hand Dominance Right   Extremity/Trunk Assessment Upper Extremity Assessment Upper Extremity Assessment: Overall WFL for tasks assessed   Lower Extremity Assessment Lower Extremity Assessment: Defer to PT evaluation   Cervical / Trunk Assessment Cervical / Trunk Assessment: Other exceptions (back surgery)   Communication Communication Communication: No difficulties   Cognition Arousal/Alertness: Awake/alert Behavior During Therapy: WFL for tasks assessed/performed Overall Cognitive Status: Within Functional Limits for tasks assessed                                     General Comments       Exercises     Shoulder Instructions      Home Living Family/patient expects to be discharged to:: Private residence Living Arrangements: Alone Available Help at Discharge: Available PRN/intermittently Type of Home: House Home Access: Stairs to  enter Entrance Stairs-Number of Steps: 7 Entrance Stairs-Rails: Can reach both Home Layout: One level     Bathroom Shower/Tub: Walk-in shower;Tub/shower unit   Teacher, early years/pre: Yes How Accessible: Accessible via walker Home Equipment: None          Prior Functioning/Environment Level of Independence: Independent                 OT Problem List: Decreased strength;Decreased  activity tolerance;Impaired balance (sitting and/or standing);Decreased knowledge of use of DME or AE;Decreased knowledge of precautions;Pain      OT Treatment/Interventions: Self-care/ADL training;DME and/or AE instruction;Therapeutic activities;Patient/family education    OT Goals(Current goals can be found in the care plan section) Acute Rehab OT Goals Patient Stated Goal: to go home OT Goal Formulation: With patient Time For Goal Achievement: 05/07/17 Potential to Achieve Goals: Good  OT Frequency: Min 2X/week   Barriers to D/C:            Co-evaluation              AM-PAC PT "6 Clicks" Daily Activity     Outcome Measure Help from another person eating meals?: None Help from another person taking care of personal grooming?: None Help from another person toileting, which includes using toliet, bedpan, or urinal?: None Help from another person bathing (including washing, rinsing, drying)?: A Little Help from another person to put on and taking off regular upper body clothing?: None Help from another person to put on and taking off regular lower body clothing?: A Little 6 Click Score: 22   End of Session Nurse Communication: Mobility status  Activity Tolerance: Patient tolerated treatment well Patient left: in bed;with call bell/phone within reach;with family/visitor present  OT Visit Diagnosis: Unsteadiness on feet (R26.81);Pain Pain - part of body:  (back)                Time: 1355-1410 OT Time Calculation (min): 15 min Charges:  OT General Charges $OT Visit: 1 Procedure OT Evaluation $OT Eval Low Complexity: 1 Procedure G-Codes:     Sergio Boyd, OT/L  [phone] 04/23/2017  Madisynn Plair,HILLARY 04/23/2017, 2:27 PM

## 2017-04-23 NOTE — Progress Notes (Signed)
Patient c/o lack of sleep and continual h/a that has lingered since surgery around right and left eye. Patient HOB at 30 degrees. Patient states h/a did not increase when HOB lifted. RN provided education on back precautions and signs and symptoms of infection and CSF leak. Patient v/u. Rn provided patient reassurance. RN will continue to monitor.

## 2017-04-23 NOTE — Progress Notes (Signed)
Patient states he lives alone and does not have a very strong support network. Patient states he doesn't have anyone that will be or able to stay with him once discharged. Case managment consult entered.

## 2017-04-24 ENCOUNTER — Inpatient Hospital Stay (HOSPITAL_COMMUNITY): Payer: BLUE CROSS/BLUE SHIELD | Admitting: Certified Registered Nurse Anesthetist

## 2017-04-24 ENCOUNTER — Encounter (HOSPITAL_COMMUNITY)
Admission: RE | Disposition: A | Discharge status: Home / Self Care | Payer: Self-pay | Source: Ambulatory Visit | Attending: Neurological Surgery

## 2017-04-24 HISTORY — PX: PLACEMENT OF LUMBAR DRAIN: SHX6028

## 2017-04-24 HISTORY — PX: LUMBAR WOUND DEBRIDEMENT: SHX1988

## 2017-04-24 LAB — TYPE AND SCREEN
ABO/RH(D): O POS
ANTIBODY SCREEN: NEGATIVE

## 2017-04-24 LAB — ABO/RH: ABO/RH(D): O POS

## 2017-04-24 SURGERY — LUMBAR WOUND DEBRIDEMENT
Anesthesia: General | Site: Back

## 2017-04-24 MED ORDER — THROMBIN 5000 UNITS EX SOLR
CUTANEOUS | Status: AC
  Filled 2017-04-24: qty 10000

## 2017-04-24 MED ORDER — ROCURONIUM BROMIDE 100 MG/10ML IV SOLN
INTRAVENOUS | Status: DC | PRN
  Administered 2017-04-24: 50 mg via INTRAVENOUS

## 2017-04-24 MED ORDER — THROMBIN 5000 UNITS EX SOLR
CUTANEOUS | Status: AC
  Filled 2017-04-24: qty 5000

## 2017-04-24 MED ORDER — HYDROMORPHONE HCL 1 MG/ML IJ SOLN
INTRAMUSCULAR | Status: AC
  Administered 2017-04-24: 0.5 mg via INTRAVENOUS
  Filled 2017-04-24: qty 1

## 2017-04-24 MED ORDER — MIDAZOLAM HCL 5 MG/5ML IJ SOLN
INTRAMUSCULAR | Status: DC | PRN
  Administered 2017-04-24: 2 mg via INTRAVENOUS

## 2017-04-24 MED ORDER — MIDAZOLAM HCL 2 MG/2ML IJ SOLN
INTRAMUSCULAR | Status: AC
  Filled 2017-04-24: qty 2

## 2017-04-24 MED ORDER — DEXAMETHASONE SODIUM PHOSPHATE 4 MG/ML IJ SOLN
INTRAMUSCULAR | Status: DC | PRN
  Administered 2017-04-24: 10 mg via INTRAVENOUS

## 2017-04-24 MED ORDER — CEFAZOLIN SODIUM-DEXTROSE 2-4 GM/100ML-% IV SOLN
2.0000 g | INTRAVENOUS | Status: AC
  Administered 2017-04-24: 2 g via INTRAVENOUS

## 2017-04-24 MED ORDER — GELATIN ABSORBABLE 12-7 MM EX MISC
CUTANEOUS | Status: DC | PRN
  Administered 2017-04-24: 1 via TOPICAL

## 2017-04-24 MED ORDER — THROMBIN 20000 UNITS EX SOLR
CUTANEOUS | Status: AC
  Filled 2017-04-24: qty 20000

## 2017-04-24 MED ORDER — ONDANSETRON HCL 4 MG/2ML IJ SOLN: 4.0000 mg | Freq: Once | INTRAMUSCULAR | Status: DC | PRN

## 2017-04-24 MED ORDER — LIDOCAINE-EPINEPHRINE 2 %-1:100000 IJ SOLN
INTRAMUSCULAR | Status: AC
  Filled 2017-04-24: qty 1

## 2017-04-24 MED ORDER — VANCOMYCIN HCL 1000 MG IV SOLR
INTRAVENOUS | Status: AC
  Filled 2017-04-24: qty 1000

## 2017-04-24 MED ORDER — PROPOFOL 10 MG/ML IV BOLUS
INTRAVENOUS | Status: AC
  Filled 2017-04-24: qty 20

## 2017-04-24 MED ORDER — LIDOCAINE HCL (CARDIAC) 20 MG/ML IV SOLN
INTRAVENOUS | Status: DC | PRN
  Administered 2017-04-24: 100 mg via INTRAVENOUS

## 2017-04-24 MED ORDER — MEPERIDINE HCL 25 MG/ML IJ SOLN
6.2500 mg | INTRAMUSCULAR | Status: DC | PRN
Start: 2017-04-24 — End: 2017-04-25

## 2017-04-24 MED ORDER — BUPIVACAINE HCL (PF) 0.5 % IJ SOLN
INTRAMUSCULAR | Status: DC | PRN
  Administered 2017-04-24: 15 mL

## 2017-04-24 MED ORDER — OXYCODONE HCL 5 MG PO TABS
ORAL_TABLET | ORAL | Status: AC
  Filled 2017-04-24: qty 1

## 2017-04-24 MED ORDER — VANCOMYCIN HCL 1000 MG IV SOLR
INTRAVENOUS | Status: DC | PRN
  Administered 2017-04-24: 1 g via TOPICAL

## 2017-04-24 MED ORDER — FENTANYL CITRATE (PF) 100 MCG/2ML IJ SOLN
INTRAMUSCULAR | Status: DC | PRN
  Administered 2017-04-24: 150 ug via INTRAVENOUS
  Administered 2017-04-24 (×2): 50 ug via INTRAVENOUS

## 2017-04-24 MED ORDER — HYDROMORPHONE HCL 1 MG/ML IJ SOLN
0.2500 mg | INTRAMUSCULAR | Status: DC | PRN
  Administered 2017-04-24 (×4): 0.5 mg via INTRAVENOUS

## 2017-04-24 MED ORDER — CEFAZOLIN SODIUM-DEXTROSE 2-4 GM/100ML-% IV SOLN
INTRAVENOUS | Status: AC
  Filled 2017-04-24: qty 100

## 2017-04-24 MED ORDER — BACITRACIN 50000 UNITS IM SOLR
INTRAMUSCULAR | Status: DC | PRN
  Administered 2017-04-24: 500 mL

## 2017-04-24 MED ORDER — BUPIVACAINE LIPOSOME 1.3 % IJ SUSP
20.0000 mL | Freq: Once | INTRAMUSCULAR | Status: DC
  Filled 2017-04-24: qty 20

## 2017-04-24 MED ORDER — LACTATED RINGERS IV SOLN
INTRAVENOUS | Status: DC
  Administered 2017-04-24 (×2): via INTRAVENOUS

## 2017-04-24 MED ORDER — BUPIVACAINE HCL (PF) 0.5 % IJ SOLN
INTRAMUSCULAR | Status: AC
  Filled 2017-04-24: qty 30

## 2017-04-24 MED ORDER — THROMBIN 5000 UNITS EX SOLR
CUTANEOUS | Status: DC | PRN
  Administered 2017-04-24: 5000 [IU] via TOPICAL

## 2017-04-24 MED ORDER — BUPIVACAINE LIPOSOME 1.3 % IJ SUSP
INTRAMUSCULAR | Status: DC | PRN
  Administered 2017-04-24: 20 mL

## 2017-04-24 MED ORDER — SUGAMMADEX SODIUM 200 MG/2ML IV SOLN
INTRAVENOUS | Status: DC | PRN
  Administered 2017-04-24: 200 mg via INTRAVENOUS

## 2017-04-24 MED ORDER — LIDOCAINE-EPINEPHRINE 2 %-1:100000 IJ SOLN
INTRAMUSCULAR | Status: DC | PRN
  Administered 2017-04-24: 15 mL

## 2017-04-24 MED ORDER — 0.9 % SODIUM CHLORIDE (POUR BTL) OPTIME
TOPICAL | Status: DC | PRN
  Administered 2017-04-24: 1000 mL

## 2017-04-24 MED ORDER — PROPOFOL 10 MG/ML IV BOLUS
INTRAVENOUS | Status: DC | PRN
  Administered 2017-04-24: 200 mg via INTRAVENOUS

## 2017-04-24 MED ORDER — FENTANYL CITRATE (PF) 250 MCG/5ML IJ SOLN
INTRAMUSCULAR | Status: AC
  Filled 2017-04-24: qty 5

## 2017-04-24 SURGICAL SUPPLY — 88 items
ADH SKN CLS APL DERMABOND .7 (GAUZE/BANDAGES/DRESSINGS) ×1
APL SKNCLS STERI-STRIP NONHPOA (GAUZE/BANDAGES/DRESSINGS)
BAG DECANTER FOR FLEXI CONT (MISCELLANEOUS) ×2 IMPLANT
BENZOIN TINCTURE PRP APPL 2/3 (GAUZE/BANDAGES/DRESSINGS) IMPLANT
BIT DRILL NEURO 2X3.1 SFT TUCH (MISCELLANEOUS) ×1 IMPLANT
BLADE CLIPPER SURG (BLADE) IMPLANT
BLADE SURG 11 STRL SS (BLADE) ×2 IMPLANT
BLADE SURG 15 STRL LF DISP TIS (BLADE) ×1 IMPLANT
BLADE SURG 15 STRL SS (BLADE) ×2
BUR ROUND FLUTED 5 RND (BURR) ×2 IMPLANT
CANISTER SUCT 3000ML PPV (MISCELLANEOUS) ×4 IMPLANT
CARTRIDGE OIL MAESTRO DRILL (MISCELLANEOUS) ×2 IMPLANT
CHLORAPREP W/TINT 26ML (MISCELLANEOUS) ×2 IMPLANT
CONT SPEC 4OZ CLIKSEAL STRL BL (MISCELLANEOUS) ×2 IMPLANT
DECANTER SPIKE VIAL GLASS SM (MISCELLANEOUS) ×2 IMPLANT
DERMABOND ADVANCED (GAUZE/BANDAGES/DRESSINGS) ×1
DERMABOND ADVANCED .7 DNX12 (GAUZE/BANDAGES/DRESSINGS) ×2 IMPLANT
DIFFUSER DRILL AIR PNEUMATIC (MISCELLANEOUS) ×4 IMPLANT
DRAIN SUBARACHNOID (WOUND CARE) ×1 IMPLANT
DRAPE C-ARM 42X72 X-RAY (DRAPES) ×3 IMPLANT
DRAPE HALF SHEET 40X57 (DRAPES) ×2 IMPLANT
DRAPE LAPAROTOMY 100X72X124 (DRAPES) ×2 IMPLANT
DRAPE MICROSCOPE LEICA (MISCELLANEOUS) ×2 IMPLANT
DRAPE POUCH INSTRU U-SHP 10X18 (DRAPES) ×2 IMPLANT
DRAPE SURG 17X23 STRL (DRAPES) ×4 IMPLANT
DRILL NEURO 2X3.1 SOFT TOUCH (MISCELLANEOUS) ×2
DURAPREP 26ML APPLICATOR (WOUND CARE) ×2 IMPLANT
ELECT REM PT RETURN 9FT ADLT (ELECTROSURGICAL) ×2
ELECTRODE REM PT RTRN 9FT ADLT (ELECTROSURGICAL) ×1 IMPLANT
GAUZE SPONGE 4X4 12PLY STRL (GAUZE/BANDAGES/DRESSINGS) IMPLANT
GAUZE SPONGE 4X4 16PLY XRAY LF (GAUZE/BANDAGES/DRESSINGS) ×2 IMPLANT
GLOVE BIO SURGEON STRL SZ7 (GLOVE) IMPLANT
GLOVE BIOGEL PI IND STRL 7.0 (GLOVE) IMPLANT
GLOVE BIOGEL PI IND STRL 7.5 (GLOVE) ×2 IMPLANT
GLOVE BIOGEL PI INDICATOR 7.0 (GLOVE)
GLOVE BIOGEL PI INDICATOR 7.5 (GLOVE) ×2
GLOVE ECLIPSE 7.0 STRL STRAW (GLOVE) ×2 IMPLANT
GLOVE EXAM NITRILE LRG STRL (GLOVE) IMPLANT
GLOVE EXAM NITRILE XL STR (GLOVE) IMPLANT
GLOVE EXAM NITRILE XS STR PU (GLOVE) IMPLANT
GLOVE SS BIOGEL STRL SZ 7.5 (GLOVE) ×2 IMPLANT
GLOVE SUPERSENSE BIOGEL SZ 7.5 (GLOVE) ×2
GOWN STRL REUS W/ TWL LRG LVL3 (GOWN DISPOSABLE) ×3 IMPLANT
GOWN STRL REUS W/ TWL XL LVL3 (GOWN DISPOSABLE) IMPLANT
GOWN STRL REUS W/TWL 2XL LVL3 (GOWN DISPOSABLE) IMPLANT
GOWN STRL REUS W/TWL LRG LVL3 (GOWN DISPOSABLE) ×6
GOWN STRL REUS W/TWL XL LVL3 (GOWN DISPOSABLE)
GRAFT DURAGEN MATRIX 1WX1L (Tissue) ×1 IMPLANT
HEMOSTAT POWDER KIT SURGIFOAM (HEMOSTASIS) ×2 IMPLANT
KIT BASIN OR (CUSTOM PROCEDURE TRAY) ×4 IMPLANT
KIT ROOM TURNOVER OR (KITS) ×4 IMPLANT
NDL HYPO 18GX1.5 BLUNT FILL (NEEDLE) ×1 IMPLANT
NDL HYPO 21X1.5 SAFETY (NEEDLE) ×2 IMPLANT
NDL HYPO 25X1 1.5 SAFETY (NEEDLE) ×2 IMPLANT
NEEDLE HYPO 18GX1.5 BLUNT FILL (NEEDLE) ×2 IMPLANT
NEEDLE HYPO 21X1.5 SAFETY (NEEDLE) ×4 IMPLANT
NEEDLE HYPO 25X1 1.5 SAFETY (NEEDLE) ×4 IMPLANT
NS IRRIG 1000ML POUR BTL (IV SOLUTION) ×4 IMPLANT
OIL CARTRIDGE MAESTRO DRILL (MISCELLANEOUS) ×4
PACK LAMINECTOMY NEURO (CUSTOM PROCEDURE TRAY) ×2 IMPLANT
PACK SURGICAL SETUP 50X90 (CUSTOM PROCEDURE TRAY) ×2 IMPLANT
PACK UNIVERSAL I (CUSTOM PROCEDURE TRAY) ×2 IMPLANT
PAD ARMBOARD 7.5X6 YLW CONV (MISCELLANEOUS) ×12 IMPLANT
PATTIES SURGICAL .5X1.5 (GAUZE/BANDAGES/DRESSINGS) ×2 IMPLANT
RUBBERBAND STERILE (MISCELLANEOUS) ×4 IMPLANT
SEALANT ADHERUS EXTEND TIP (MISCELLANEOUS) ×1 IMPLANT
SPECIMEN JAR SMALL (MISCELLANEOUS) IMPLANT
SPONGE NEURO XRAY DETECT 1X3 (DISPOSABLE) ×2 IMPLANT
SPONGE SURGIFOAM ABS GEL SZ50 (HEMOSTASIS) ×2 IMPLANT
STRIP CLOSURE SKIN 1/2X4 (GAUZE/BANDAGES/DRESSINGS) IMPLANT
SUT ETHILON 3 0 FSL (SUTURE) ×1 IMPLANT
SUT STRATAFIX MNCRL+ 3-0 PS-2 (SUTURE)
SUT STRATAFIX MONOCRYL 3-0 (SUTURE)
SUT VIC AB 0 CT1 18XCR BRD8 (SUTURE) ×1 IMPLANT
SUT VIC AB 0 CT1 8-18 (SUTURE) ×4
SUT VIC AB 2-0 CT1 18 (SUTURE) ×2 IMPLANT
SUT VIC AB 4-0 PS2 27 (SUTURE) IMPLANT
SUT VICRYL 3-0 RB1 18 ABS (SUTURE) ×3 IMPLANT
SUT VICRYL AB 3 0 TIES (SUTURE) ×1 IMPLANT
SUTURE STRATFX MNCRL+ 3-0 PS-2 (SUTURE) IMPLANT
SYR 10ML LL (SYRINGE) ×1 IMPLANT
SYR 30ML LL (SYRINGE) ×4 IMPLANT
SYR 5ML LL (SYRINGE) ×2 IMPLANT
SYR CONTROL 10ML LL (SYRINGE) ×4 IMPLANT
TOWEL GREEN STERILE (TOWEL DISPOSABLE) ×4 IMPLANT
TOWEL GREEN STERILE FF (TOWEL DISPOSABLE) ×4 IMPLANT
TUBE CONNECTING 12X1/4 (SUCTIONS) ×2 IMPLANT
WATER STERILE IRR 1000ML POUR (IV SOLUTION) ×2 IMPLANT

## 2017-04-24 NOTE — Brief Op Note (Signed)
04/20/2017 - 04/24/2017  8:25 PM  PATIENT:  Sergio Boyd  46 y.o. male  PRE-OPERATIVE DIAGNOSIS:  Cerebrospinal fluid  Leak  POST-OPERATIVE DIAGNOSIS:  Cerebrospinal fluid  Leak  PROCEDURE:  Procedure(s): Repair of Pseudomeningocele and Placement of Lumbar Drain (N/A) PLACEMENT OF LUMBAR DRAIN (N/A)  SURGEON:  Surgeon(s) and Role:    * Ditty, Loura HaltBenjamin Jared, MD - Primary    * Lisbeth RenshawNundkumar, Neelesh, MD - Assisting  PHYSICIAN ASSISTANT:   ASSISTANTS: Lisbeth RenshawNeelesh Nundkumar, MD  ANESTHESIA:   general  EBL:  No intake/output data recorded.  BLOOD ADMINISTERED:none  DRAINS: Lumbar drain  LOCAL MEDICATIONS USED:  MARCAINE    and LIDOCAINE   SPECIMEN:  No Specimen  DISPOSITION OF SPECIMEN:  N/A  COUNTS:  YES  TOURNIQUET:  * No tourniquets in log *  DICTATION: .Dragon Dictation  PLAN OF CARE: Admit to inpatient   PATIENT DISPOSITION:  ICU - extubated and stable.   Delay start of Pharmacological VTE agent (>24hrs) due to surgical blood loss or risk of bleeding: yes

## 2017-04-24 NOTE — OR Nursing (Signed)
After speaking to Dr. Conchita ParisNundkumar, the drain is supposed to be attached to the Limitor drain set.  He also stated the patient should be on 4N due to the drain.  I called 4N to get a drain set.  Will discuss with Dr. Danielle DessElsner when he is done in the OR to attach the limitor set.

## 2017-04-24 NOTE — Care Management Note (Signed)
Case Management Note  Patient Details  Name: Sergio BeaversMiguel Boyd MRN: [id number] Date of Birth: 1971/01/02  Subjective/Objective:                    Action/Plan: Plan is for patient to have lumbar drain placed today. Pt is from home alone. No f/u per PT/OT but they are recommending a 3 in 1. CM following for d/c needs, physician orders.   Expected Discharge Date:                  Expected Discharge Plan:  Home/Self Care  In-House Referral:     Discharge planning Services     Post Acute Care Choice:    Choice offered to:     DME Arranged:    DME Agency:     HH Arranged:    HH Agency:     Status of Service:  In process, will continue to follow  If discussed at Long Length of Stay Meetings, dates discussed:    Additional Comments:  Kermit BaloKelli F Soma Lizak, RN 04/24/2017, 10:57 AM

## 2017-04-24 NOTE — Transfer of Care (Signed)
Immediate Anesthesia Transfer of Care Note  Patient: Wendee BeaversMiguel Bartoli  Procedure(s) Performed: Procedure(s): Repair of Pseudomeningocele and Placement of Lumbar Drain (N/A) PLACEMENT OF LUMBAR DRAIN (N/A)  Patient Location: PACU  Anesthesia Type:General  Level of Consciousness: awake, alert  and oriented  Airway & Oxygen Therapy: Patient Spontanous Breathing and Patient connected to nasal cannula oxygen  Post-op Assessment: Report given to RN and Post -op Vital signs reviewed and stable  Post vital signs: Reviewed and stable  Last Vitals:  Vitals:   04/24/17 1040 04/24/17 1434  BP: 122/69 112/67  Pulse: 63 60  Resp: 16 17  Temp: 37.1 C 37.1 C    Last Pain:  Vitals:   04/24/17 1434  TempSrc: Oral  PainSc:       Patients Stated Pain Goal: 4 (04/24/17 0930)  Complications: No apparent anesthesia complications

## 2017-04-24 NOTE — Progress Notes (Signed)
Occupational Therapy Treatment Patient Details Name: Sergio Boyd MRN: [id number] DOB: [birth year] Today's Date: 04/24/2017    History of present illness patient is a 46 yo male s/p Re-operative laminectomy right L5-S1, left L5-S1 laminectomy for decompression; repair of pseudomeningocele   OT comments  Completed education regarding compensatory techniques for ADL with necessary AE and DME. Discussed use of 3in1 as shower chair. Educated on proper set up of home to maximize independence while adhering to back precautions. Pt has met goals. Per discussion with pt after session, pt states that he is having surgery again and will be in ICU for 3 days. OT signing off at this time. Will need reorder if services needed after surgery.   Follow Up Recommendations  No OT follow up;Supervision - Intermittent    Equipment Recommendations  3 in 1 bedside commode    Recommendations for Other Services      Precautions / Restrictions Precautions Precautions: Back Precaution Comments: pt able to recall 3/3 precautions       Mobility Bed Mobility Overal bed mobility: Modified Independent                Transfers Overall transfer level: Modified independent                    Balance Overall balance assessment: No apparent balance deficits (not formally assessed)                                         ADL either performed or assessed with clinical judgement   ADL                                       Functional mobility during ADLs: Modified independent (Educated on shower transfers) General ADL Comments: Completed education regarding compensatory techniques for ADL and use of AE for LB ADL as needed. also discussed proper set u p ofhome to maximize independence with ADL and mobility while adhering to back precautions. Pt verblaized understanding.      Vision       Perception     Praxis      Cognition Arousal/Alertness:  Awake/alert Behavior During Therapy: WFL for tasks assessed/performed Overall Cognitive Status: Within Functional Limits for tasks assessed                                          Exercises     Shoulder Instructions       General Comments      Pertinent Vitals/ Pain       Pain Assessment: Faces Faces Pain Scale: Hurts a little bit Pain Location: right side low back  Pain Descriptors / Indicators: Sore Pain Intervention(s): Limited activity within patient's tolerance  Home Living                                          Prior Functioning/Environment              Frequency  Min 2X/week        Progress Toward Goals  OT Goals(current goals can now be found in the care  plan section)  Progress towards OT goals: Goals met/education completed, patient discharged from OT  Acute Rehab OT Goals Patient Stated Goal: to go home OT Goal Formulation: With patient Time For Goal Achievement: 05/07/17 Potential to Achieve Goals: Good ADL Goals Pt Will Perform Lower Body Bathing: with modified independence;sit to/from stand Pt Will Perform Lower Body Dressing: with modified independence;sit to/from stand;with adaptive equipment Pt Will Perform Toileting - Clothing Manipulation and hygiene: with modified independence;sit to/from stand Pt Will Perform Tub/Shower Transfer: Shower transfer;with modified independence;ambulating;3 in 1 Additional ADL Goal #1: Pt will independently verbalie understanding of 3/3 back precautions for ADL and funcitonal mobility tasks.  Plan All goals met and education completed, patient discharged from OT services    Co-evaluation                 AM-PAC PT "6 Clicks" Daily Activity     Outcome Measure   Help from another person eating meals?: None Help from another person taking care of personal grooming?: None Help from another person toileting, which includes using toliet, bedpan, or urinal?: None Help  from another person bathing (including washing, rinsing, drying)?: A Little Help from another person to put on and taking off regular upper body clothing?: None Help from another person to put on and taking off regular lower body clothing?: A Little 6 Click Score: 22    End of Session    OT Visit Diagnosis: Unsteadiness on feet (R26.81);Pain Pain - part of body:  (back)   Activity Tolerance Patient tolerated treatment well   Patient Left in chair;with call bell/phone within reach   Nurse Communication Mobility status        Time: 2552-5894 OT Time Calculation (min): 15 min  Charges: OT General Charges $OT Visit: 1 Procedure OT Treatments $Self Care/Home Management : 8-22 mins  [facility], OT/L  [phone] 04/24/2017   Slevin Gunby,HILLARY 04/24/2017, 9:18 AM

## 2017-04-24 NOTE — Anesthesia Preprocedure Evaluation (Signed)
Anesthesia Evaluation  Patient identified by MRN, date of birth, ID band Patient awake    Reviewed: Allergy & Precautions, NPO status , Patient's Chart, lab work & pertinent test results  Airway Mallampati: I  TM Distance: >3 FB Neck ROM: Full    Dental   Pulmonary Current Smoker,    Pulmonary exam normal        Cardiovascular Normal cardiovascular exam     Neuro/Psych    GI/Hepatic GERD  Medicated and Controlled,  Endo/Other    Renal/GU      Musculoskeletal   Abdominal   Peds  Hematology   Anesthesia Other Findings   Reproductive/Obstetrics                             Anesthesia Physical Anesthesia Plan  ASA: II and emergent  Anesthesia Plan: General   Post-op Pain Management:    Induction: Intravenous  Airway Management Planned: Oral ETT  Additional Equipment:   Intra-op Plan:   Post-operative Plan: Extubation in OR  Informed Consent: I have reviewed the patients History and Physical, chart, labs and discussed the procedure including the risks, benefits and alternatives for the proposed anesthesia with the patient or authorized representative who has indicated his/her understanding and acceptance.     Plan Discussed with: CRNA and Surgeon  Anesthesia Plan Comments:         Anesthesia Quick Evaluation

## 2017-04-24 NOTE — Progress Notes (Addendum)
Physical Therapy Treatment Patient Details Name: Sergio Boyd MRN: [id number] DOB: 09/23/1971 Today's Date: 04/24/2017  Acute PT to sign off at this time, and await new orders post procedure.  History of Present Illness patient is a 46 yo male s/p Re-operative laminectomy right L5-S1, left L5-S1 laminectomy for decompression; repair of pseudomeningocele    PT Comments    Pt ambulated ~162ft with supervision. Pt continued to complain of mild RLE pain during activity. PT provided education regarding back precautions and activity limitations upon d/c home. No PT follow up recommended until after surgery and off bedrest.   Follow Up Recommendations  No PT follow up     Equipment Recommendations  None recommended by PT    Recommendations for Other Services       Precautions / Restrictions Precautions Precautions: Back Precaution Comments: pt able to recall 3/3 precautions    Mobility  Bed Mobility Overal bed mobility: Modified Independent             General bed mobility comments: received in sitting   Transfers Overall transfer level: Modified independent Equipment used: None Transfers: Sit to/from Stand Sit to Stand: Modified independent (Device/Increase time)         General transfer comment: Increased time for sit <> stand due to guarding and pain  Ambulation/Gait Ambulation/Gait assistance: Supervision Ambulation Distance (Feet): 150 Feet Assistive device: 4-wheeled walker;None Gait Pattern/deviations: Step-through pattern;Decreased stride length Gait velocity: decreased   General Gait Details: decreased stride length requiring VCs to increase cadence    Stairs            Wheelchair Mobility    Modified Rankin (Stroke Patients Only)       Balance Overall balance assessment: No apparent balance deficits (not formally assessed)   Sitting balance-Leahy Scale: Good       Standing balance-Leahy Scale: Fair                               Cognition Arousal/Alertness: Awake/alert Behavior During Therapy: WFL for tasks assessed/performed Overall Cognitive Status: Within Functional Limits for tasks assessed                                        Exercises      General Comments        Pertinent Vitals/Pain Pain Assessment: Faces Faces Pain Scale: Hurts a little bit Pain Location: right side low back  Pain Descriptors / Indicators: Sore Pain Intervention(s): Limited activity within patient's tolerance;Monitored during session    Home Living                      Prior Function            PT Goals (current goals can now be found in the care plan section) Acute Rehab PT Goals Patient Stated Goal: to go home  PT Goal Formulation: With patient Time For Goal Achievement: 05/07/17 Potential to Achieve Goals: Good Progress towards PT goals: PT to reassess next treatment    Frequency    Min 5X/week      PT Plan      Co-evaluation              AM-PAC PT "6 Clicks" Daily Activity  Outcome Measure  Difficulty turning over in bed (including adjusting bedclothes, sheets and blankets)?: A Little Difficulty moving  from lying on back to sitting on the side of the bed? : A Little Difficulty sitting down on and standing up from a chair with arms (e.g., wheelchair, bedside commode, etc,.)?: A Little Help needed moving to and from a bed to chair (including a wheelchair)?: A Little Help needed walking in hospital room?: A Little Help needed climbing 3-5 steps with a railing? : A Little 6 Click Score: 18    End of Session Equipment Utilized During Treatment: Gait belt Activity Tolerance: Patient tolerated treatment well Patient left: in bed;with call bell/phone within reach;with family/visitor present Nurse Communication: Mobility status PT Visit Diagnosis: Unsteadiness on feet (R26.81)     Time: 1005-1016 PT Time Calculation (min) (ACUTE ONLY): 11 min  Charges:  $Gait  Training: 8-22 mins                    G CodesCorlis Leak:       Taylor Guardalabene, SPT  [phone]   Corlis Leakaylor Guardalabene 04/24/2017, 10:27 AM

## 2017-04-24 NOTE — OR Nursing (Signed)
Pt arrived from the OR on a bed to pacu.  Pt was attached to medical devices.  Pt had a lumbar drain to straight drain with the stopcock turned off to the patient.  I was told in hand off report to leave it off to the patient until he gets to the Neuro ICU.  Dr. Mervyn Gayitty's order is written to limitor drain at 10cc/hr.  As the drain is currently set up as staight drain, there is no way to regulate/monitor hourly drainage.  I called [facility] to discuss case and trouble shoot.  Dr. Conchita ParisNundkumar is on call and contacted through Amion.

## 2017-04-24 NOTE — Anesthesia Procedure Notes (Signed)
Procedure Name: Intubation Date/Time: 04/24/2017 6:46 PM Performed by: Oletta Lamas Pre-anesthesia Checklist: Patient identified, Emergency Drugs available, Suction available and Patient being monitored Patient Re-evaluated:Patient Re-evaluated prior to inductionOxygen Delivery Method: Circle System Utilized Preoxygenation: Pre-oxygenation with 100% oxygen Intubation Type: IV induction Ventilation: Mask ventilation without difficulty Laryngoscope Size: Mac and 4 Grade View: Grade I Tube type: Oral Number of attempts: 1 Airway Equipment and Method: Stylet Placement Confirmation: ETT inserted through vocal cords under direct vision,  positive ETCO2 and breath sounds checked- equal and bilateral Secured at: 22 cm Tube secured with: Tape Dental Injury: Teeth and Oropharynx as per pre-operative assessment

## 2017-04-24 NOTE — Progress Notes (Signed)
Pt seen and examined. No issues overnight.  EXAM: Temp:  [98.1 F (36.7 C)-98.7 F (37.1 C)] 98.7 F (37.1 C) (05/15 16100632) Pulse Rate:  [56-69] 56 (05/15 0632) Resp:  [17-20] 18 (05/15 96040632) BP: (108-130)/(57-87) 113/68 (05/15 54090632) SpO2:  [100 %] 100 % (05/15 81190632) Intake/Output      05/14 0701 - 05/15 0700 05/15 0701 - 05/16 0700   P.O.     I.V. (mL/kg)     Total Intake(mL/kg)     Urine (mL/kg/hr)     Total Output       Net             Awake and alert Follows commands throughout Full strength Swelling and fluctuance of lumbar incision consistent with pseudomeningocele  Recurrence of pseudomeningocele despite repair I think he needs a lumbar drain to take pressure of the repair I've discussed this with him, including the risks, benefits, and alternatives.  He agrees to proceed.

## 2017-04-24 NOTE — Anesthesia Postprocedure Evaluation (Signed)
Anesthesia Post Note  Patient: Sergio Boyd  Procedure(s) Performed: Procedure(s) (LRB): Repair of Pseudomeningocele and Placement of Lumbar Drain (N/A) PLACEMENT OF LUMBAR DRAIN (N/A)  Patient location during evaluation: PACU Anesthesia Type: General Level of consciousness: awake and alert Pain management: pain level controlled Vital Signs Assessment: post-procedure vital signs reviewed and stable Respiratory status: spontaneous breathing, nonlabored ventilation, respiratory function stable and patient connected to nasal cannula oxygen Cardiovascular status: blood pressure returned to baseline and stable Postop Assessment: no signs of nausea or vomiting Anesthetic complications: no       Last Vitals:  Vitals:   04/24/17 2215 04/24/17 2230  BP: 120/68 115/72  Pulse: (!) 57 (!) 52  Resp: 11 11  Temp:      Last Pain:  Vitals:   04/24/17 2215  TempSrc:   PainSc: 8                  Cecile HearingStephen Edward Turk

## 2017-04-25 ENCOUNTER — Encounter (HOSPITAL_COMMUNITY): Payer: Self-pay | Admitting: Neurological Surgery

## 2017-04-25 MED ORDER — OXYCODONE HCL 5 MG PO TABS
ORAL_TABLET | ORAL | Status: AC
  Administered 2017-04-25: 10 mg via ORAL
  Filled 2017-04-25: qty 2

## 2017-04-25 MED ORDER — ACETAZOLAMIDE ER 500 MG PO CP12
500.0000 mg | ORAL_CAPSULE | Freq: Two times a day (BID) | ORAL | Status: DC
  Administered 2017-04-25 – 2017-05-05 (×22): 500 mg via ORAL
  Filled 2017-04-25 (×24): qty 1

## 2017-04-25 NOTE — Progress Notes (Signed)
MD Ditty paged to notify that lumbar drain has been clamped all night.

## 2017-04-25 NOTE — OR Nursing (Addendum)
Dr. Danielle DessElsner at bedside stated that it was ok for the lumbar drain to stay the way it is at this time and that they will get it set up to the limitor in the morning. Currently the drain is turned off to pt.  Dr. Danielle DessElsner also stated that there was no need to call Dr. Conchita ParisNundkumar or Ditty. Voiced concerns of clotting or not working properly if it was not on the limitor and MD reassured us that it was ok.

## 2017-04-25 NOTE — Progress Notes (Signed)
No acute events Lumbar drain patent and functioning Moving legs well Incision c/d/i and flat Stable Continue drainage

## 2017-04-25 NOTE — Progress Notes (Signed)
PA Vinny at bedside, Lumbar drain connected to Limitor device, Drain patent and slowly draining.

## 2017-04-26 MED ORDER — PANTOPRAZOLE SODIUM 40 MG IV SOLR
40.0000 mg | Freq: Two times a day (BID) | INTRAVENOUS | Status: DC
  Administered 2017-04-26 – 2017-04-30 (×10): 40 mg via INTRAVENOUS
  Filled 2017-04-26 (×10): qty 40

## 2017-04-26 NOTE — Progress Notes (Signed)
Lumbar drain stopped draining an hour ago Flushed now moving Moving legs well Incision c/d/i and flat Start protonix IV bid for GERD Continue to remain flat

## 2017-04-27 NOTE — Progress Notes (Signed)
Lumbar drain patent; functioning well Moving legs well Incision c/d/i flat Protonix seems to be helping Plan to clamp drain in AM if wound looks good; hopefully remove drain on Sunday

## 2017-04-28 NOTE — Progress Notes (Signed)
Patient ID: Sergio BeaversMiguel Boyd, male   DOB: 05-02-1971, 46 y.o.   MRN: [id number] No complaints, moving legs well, dressing dry and flat, clamp LD and mobilize today

## 2017-04-29 MED ORDER — BUTALBITAL-APAP-CAFFEINE 50-325-40 MG PO TABS
1.0000 | ORAL_TABLET | ORAL | Status: DC | PRN
  Administered 2017-04-29 – 2017-05-08 (×18): 1 via ORAL
  Filled 2017-04-29 (×19): qty 1

## 2017-04-29 NOTE — Progress Notes (Signed)
Patient ID: Wendee BeaversMiguel Kassel, male   DOB: [birth year], 46 y.o.   MRN: [id number] Patient complains of headache and neck pain today. Drain has been clamped for 24 hours. Wound remains clean and flat. We are going to try Fioricet today. Given his symptoms I'm not comfortable with removing the drain. I would rather Dr. Bevely Palmerditty reevaluate him tomorrow prior to lumbar drain removal

## 2017-04-29 NOTE — Plan of Care (Signed)
Problem: Education: Goal: Knowledge of [facility] Education information/materials will improve Outcome: Progressing Discussed with patient the different aspects of his care and what to expect while here at the hospital.

## 2017-04-30 MED ORDER — DIAZEPAM 5 MG PO TABS
ORAL_TABLET | ORAL | Status: AC
  Filled 2017-04-30: qty 1

## 2017-04-30 MED ORDER — DIAZEPAM 5 MG PO TABS
5.0000 mg | ORAL_TABLET | Freq: Once | ORAL | Status: AC
  Administered 2017-04-30: 5 mg via ORAL

## 2017-04-30 MED ORDER — CARISOPRODOL 350 MG PO TABS
350.0000 mg | ORAL_TABLET | Freq: Four times a day (QID) | ORAL | Status: DC | PRN
  Administered 2017-05-02 – 2017-05-08 (×12): 350 mg via ORAL
  Filled 2017-04-30 (×13): qty 1

## 2017-04-30 MED ORDER — POLYETHYLENE GLYCOL 3350 17 G PO PACK: 17.0000 g | PACK | Freq: Every day | ORAL | Status: DC | PRN

## 2017-04-30 NOTE — Progress Notes (Signed)
Dr Ditty encouraged patient to get up and moving this afternoon. Therapies were ordered. Pt with persistent H/A. Patient walked around the room and to the bathroom with one assist. He refused an enema early this afternoon but feels like he can have a BM on his own now that he is out of the bed. Pt to transfer to 5C04. Report called. Caulin Begley, [place] ScrapeSarah E, RN

## 2017-04-30 NOTE — Progress Notes (Signed)
Pt. Received from [facility]. Pt. In significant pain on arrival; had Oxy and Valium prior to transfer. Has not had a BM since 5/15 per report and refusing Enema and Dulcolax per Maralyn SagoSarah from [facility]. Encouraged pt to take Miralax, and he is willing to take it once pain is more under control. Pt. Made comfortable in bed, will cont to re-assess.

## 2017-04-30 NOTE — Progress Notes (Signed)
Headaches stable; likely unrelated to leak because they worsened when the drain was clamped Incision flat; no fluctuance Drain removed TTF Therapy

## 2017-04-30 NOTE — Progress Notes (Signed)
Headaches and neck pain but wound flat Will keep drain in and have him sit up and ambulate If wound remains flat this afternoon will d/c drain

## 2017-04-30 NOTE — Progress Notes (Signed)
PT Cancellation Note  Patient Details Name: Sergio BeaversMiguel Boyd MRN: [id number] DOB: [birth year]   Cancelled Treatment:    Reason Eval/Treat Not Completed: Other (comment).  RN recommended holding for now as pt has had significant neck pain this PM.  PT will check back tomorrow.   Thanks,    Rollene Rotundaebecca B. Maccoy Haubner, PT, DPT [phone]   04/30/2017, 4:41 PM

## 2017-05-01 ENCOUNTER — Inpatient Hospital Stay (HOSPITAL_COMMUNITY): Payer: BLUE CROSS/BLUE SHIELD

## 2017-05-01 MED ORDER — MAGNESIUM CITRATE PO SOLN
1.0000 | Freq: Once | ORAL | Status: AC
  Administered 2017-05-01: 1 via ORAL
  Filled 2017-05-01: qty 296

## 2017-05-01 MED ORDER — PANTOPRAZOLE SODIUM 40 MG PO TBEC
40.0000 mg | DELAYED_RELEASE_TABLET | Freq: Two times a day (BID) | ORAL | Status: DC
  Administered 2017-05-01 – 2017-05-08 (×15): 40 mg via ORAL
  Filled 2017-05-01 (×16): qty 1

## 2017-05-01 NOTE — Evaluation (Signed)
Occupational Therapy Evaluation Patient Details Name: Sergio BeaversMiguel Boyd MRN: [id number] DOB: 08/15/1971 Today's Date: 05/01/2017    History of Present Illness Patient is a 46 yo male s/p re-operative laminectomy right L5-S1, left L5-S1 laminectomy for decompression; repair of pseudomeningocele. S/p drain placement.    Clinical Impression   Pt presented to OT evaluation with significant neck and upper trap pain. Pt able to complete LB ADL with min assist and toilet transfers with supervision and RW this session. He demonstrates good understanding of back precautions as related to ADL, but demonstrates decreased understanding of use of AE for LB ADL. He is limited by decreased activity tolerance for ADL after period of bedrest. Pt very concerned about back pain this session and educated on relaxation techniques and positioning of arms to decrease upper trap tightness to improve tolerance for activity at this time. Pt would benefit from additional OT session to address education concerning LB ADL and use of AE as well as shower transfer.     Follow Up Recommendations  No OT follow up;Supervision - Intermittent    Equipment Recommendations  3 in 1 bedside commode    Recommendations for Other Services       Precautions / Restrictions Precautions Precautions: Back Precaution Booklet Issued: Yes (comment) Precaution Comments: pt able to recall 3/3 precautions Restrictions Weight Bearing Restrictions: No      Mobility Bed Mobility               General bed mobility comments: Sitting at EOB on arrival  Transfers Overall transfer level: Needs assistance Equipment used: Rolling walker (2 wheeled) Transfers: Sit to/from Stand Sit to Stand: Supervision         General transfer comment: Supervision for safety.     Balance Overall balance assessment: No apparent balance deficits (not formally assessed)                                         ADL either performed or  assessed with clinical judgement   ADL Overall ADL's : Needs assistance/impaired Eating/Feeding: Set up;Sitting   Grooming: Min guard;Standing   Upper Body Bathing: Supervision/ safety;Sitting   Lower Body Bathing: Minimal assistance;Sit to/from stand   Upper Body Dressing : Supervision/safety;Sitting   Lower Body Dressing: Minimal assistance;Sit to/from stand Lower Body Dressing Details (indicate cue type and reason): Difficulty lifting R LE to cross over L knee for dressing tasks.  Toilet Transfer: Min guard;Ambulation;RW   Toileting- ArchitectClothing Manipulation and Hygiene: Min guard;Sit to/from stand       Functional mobility during ADLs: Min guard;Rolling walker General ADL Comments: Educated pt concerning compensatory strategies for ADL while adheringn to back precautions and pt demonstrates good understanding of these from previous surgery. Pt remains with difficulty dressing R LE and educated on use of AE for LB ADL. Pt's greatest concern is his neck pain. Educated pt concerning relaxation of upper trapezii in order to relieve tightness and completed scapular mobilization with some relief.      Vision Patient Visual Report: No change from baseline Vision Assessment?: No apparent visual deficits     Perception     Praxis      Pertinent Vitals/Pain Pain Assessment: 0-10 Pain Score: 10-Worst pain ever Faces Pain Scale: Hurts even more Pain Location: cervical spine radiating into traps and B shoulders Pain Descriptors / Indicators: Aching;Discomfort;Sore Pain Intervention(s): Limited activity within patient's tolerance;Monitored during session;Repositioned;Utilized relaxation  techniques     Hand Dominance Right   Extremity/Trunk Assessment Upper Extremity Assessment Upper Extremity Assessment: RUE deficits/detail;LUE deficits/detail (Pain with B UE movement. ) RUE Deficits / Details: Elevated scapulae with tight trapezii impacting shoulder mobility.  LUE Deficits /  Details: Elevated scapulae with tight trapezii impacting shoulder mobility. Slight winging with forward flexion.    Lower Extremity Assessment Lower Extremity Assessment: Overall WFL for tasks assessed   Cervical / Trunk Assessment Cervical / Trunk Assessment: Other exceptions Cervical / Trunk Exceptions: s/p spinal surgery   Communication Communication Communication: No difficulties   Cognition Arousal/Alertness: Awake/alert Behavior During Therapy: Flat affect Overall Cognitive Status: Within Functional Limits for tasks assessed                                     General Comments  Pt highly concerned over neck pain and asking OT if seeing a chiropractor or getting a massage. Educated pt concerning restrictions due to spinal surgeries prohibiting these interventions at this time. Educated on relaxation techniques and positioning to improve pain and participation.     Exercises     Shoulder Instructions      Home Living Family/patient expects to be discharged to:: Private residence Living Arrangements: Alone Available Help at Discharge: Available PRN/intermittently Type of Home: House Home Access: Stairs to enter [employer] of Steps: 7 Entrance Stairs-Rails: Can reach both Home Layout: One level     Bathroom Shower/Tub: Walk-in shower;Tub/shower unit   Teacher, early years/pre: Yes How Accessible: Accessible via walker Home Equipment: None          Prior Functioning/Environment Level of Independence: Independent                 OT Problem List: Decreased strength;Decreased activity tolerance;Impaired balance (sitting and/or standing);Decreased knowledge of use of DME or AE;Decreased knowledge of precautions;Pain      OT Treatment/Interventions: Self-care/ADL training;DME and/or AE instruction;Therapeutic activities;Patient/family education    OT Goals(Current goals can be found in the care plan section)  Acute Rehab OT Goals Patient Stated Goal: to go home OT Goal Formulation: With patient Time For Goal Achievement: 05/07/17 Potential to Achieve Goals: Good ADL Goals Pt Will Perform Lower Body Dressing: with modified independence;sit to/from stand;with adaptive equipment (with or without AE) Pt Will Transfer to Toilet: with modified independence;ambulating;bedside commode (BSC over toilet) Pt Will Perform Toileting - Clothing Manipulation and hygiene: with modified independence;sit to/from stand Pt Will Perform Tub/Shower Transfer: Shower transfer;with modified independence;ambulating;3 in 1  OT Frequency: Min 2X/week   Barriers to D/C:            Co-evaluation              AM-PAC PT "6 Clicks" Daily Activity     Outcome Measure Help from another person eating meals?: None Help from another person taking care of personal grooming?: A Little Help from another person toileting, which includes using toliet, bedpan, or urinal?: A Little Help from another person bathing (including washing, rinsing, drying)?: A Little Help from another person to put on and taking off regular upper body clothing?: A Little Help from another person to put on and taking off regular lower body clothing?: A Little 6 Click Score: 19   End of Session Equipment Utilized During Treatment: Rolling walker Nurse Communication: Mobility status  Activity Tolerance: Patient tolerated treatment well Patient left: with call bell/phone within reach;in bed (sitting  at EOB)  OT Visit Diagnosis: Unsteadiness on feet (R26.81);Pain Pain - part of body:  (back)                Time: 0981-1914 OT Time Calculation (min): 25 min Charges:  OT General Charges $OT Visit: 1 Procedure OT Evaluation $OT Eval Moderate Complexity: 1 Procedure OT Treatments $Self Care/Home Management : 8-22 mins G-Codes:     Doristine Section, MS OTR/L  Pager: [phone]   Maeva Dant A Champion Corales 05/01/2017, 4:10 PM

## 2017-05-01 NOTE — Progress Notes (Signed)
Pt seen and examined.  Endorses HA and generalized discomfort Not taking narcotics due to constipation  He has not had a BM in 7 days. States hemorrhoids are the cause for his bowel troubles. Refuses enema His pain is fairly manageable without narcotics Denies motor/sensory deficits, N/T  EXAM: Temp:  [97.7 F (36.5 C)-98.5 F (36.9 C)] 98.3 F (36.8 C) (05/22 0857) Pulse Rate:  [63-103] 91 (05/22 0857) Resp:  [9-20] 20 (05/22 0857) BP: (90-157)/(58-109) 114/63 (05/22 0857) SpO2:  [93 %-100 %] 98 % (05/22 0857) Intake/Output      05/21 0701 - 05/22 0700 05/22 0701 - 05/23 0700   P.O. 480    I.V. (mL/kg)     Total Intake(mL/kg) 480 (5.3)    Urine (mL/kg/hr) 1800 (0.8) 300 (1.5)   Total Output 1800 300   Net -1320 -300         Awake and alert Follows commands throughout Full strength Wound flat, c/d/i  Doing well from NS perspective. Wound without evidence of recurrent CSF leak. Pain fairly manageable without narcotics. Will d/c Constipation/Hemrrhoids: Resume home regimen for bowels. Obtain Abd Xray. Ambulate. Encouraged hydration. Consult Gen Surgery.

## 2017-05-01 NOTE — Progress Notes (Signed)
Pt. C/o 10/10 pain; refusing all pain meds as it will "mess with my bowels." Offered alternative therapy (heat, ice, pillows;) and pt. Refused and stated it doesn't help. Refusing enema and suppository, just wants his 10am bowel meds and then will walk around. Will cont. To monitor.

## 2017-05-01 NOTE — Care Management Note (Signed)
Case Management Note  Patient Details  Name: Sergio BeaversMiguel Boyd MRN: [id number] Date of Birth: [birth year]  Subjective/Objective:   Pt s/p lumbar drain. He is from home alone.                 Action/Plan: No f/u per PT/OT. CM following for d/c needs, physician orders.  Expected Discharge Date:                  Expected Discharge Plan:  Home/Self Care  In-House Referral:     Discharge planning Services     Post Acute Care Choice:    Choice offered to:     DME Arranged:    DME Agency:     HH Arranged:    HH Agency:     Status of Service:  In process, will continue to follow  If discussed at Long Length of Stay Meetings, dates discussed:    Additional Comments:  Kermit BaloKelli F Kellyn Mansfield, RN 05/01/2017, 11:17 AM

## 2017-05-01 NOTE — Evaluation (Signed)
Physical Therapy Evaluation Patient Details Name: Sergio BeaversMiguel Duford MRN: [id number] DOB: 1971/02/08 Today's Date: 05/01/2017   History of Present Illness  patient is a 46 yo male s/p Re-operative laminectomy right L5-S1, left L5-S1 laminectomy for decompression; repair of pseudomeningocele. S/p drain placement.   Clinical Impression  Patient seen for re-evaluation s/p spinal drain placement. At this time patient demonstrates deficits in mobility as indicated. Will continue with current goals and POC.   OF NOTE: patient endorsing pain in upper right chest radiating to back region. Neuro PA present for assessment. Nsg in room. Vss with BP 120s/70s, HR 106, O2 saturation 99% on room air    Follow Up Recommendations No PT follow up    Equipment Recommendations  None recommended by PT    Recommendations for Other Services       Precautions / Restrictions Precautions Precautions: Back Precaution Booklet Issued: Yes (comment) Precaution Comments: pt able to recall 3/3 precautions Restrictions Weight Bearing Restrictions: No      Mobility  Bed Mobility               General bed mobility comments: received up with nursing  Transfers Overall transfer level: Modified independent Equipment used: None Transfers: Sit to/from Stand Sit to Stand: Modified independent (Device/Increase time)         General transfer comment: Increased time for sit <> stand due to guarding and pain  Ambulation/Gait Ambulation/Gait assistance: Min guard Ambulation Distance (Feet): 90 Feet Assistive device: Rolling walker (2 wheeled) Gait Pattern/deviations: Step-through pattern;Decreased stride length Gait velocity: decreased   General Gait Details: decreased stride length requiring VCs to increase cadence   Stairs            Wheelchair Mobility    Modified Rankin (Stroke Patients Only)       Balance Overall balance assessment: No apparent balance deficits (not formally assessed)    Sitting balance-Leahy Scale: Good       Standing balance-Leahy Scale: Fair Standing balance comment: use of bilateral UE support at times, but able to static stand without support               High Level Balance Comments: modest instability             Pertinent Vitals/Pain Pain Assessment: Faces Faces Pain Scale: Hurts even more Pain Location: right upper chest radiating to his back Pain Descriptors / Indicators: Sharp Pain Intervention(s): Limited activity within patient's tolerance;Monitored during session;Repositioned    Home Living Family/patient expects to be discharged to:: Private residence Living Arrangements: Alone Available Help at Discharge: Available PRN/intermittently Type of Home: House Home Access: Stairs to enter Entrance Stairs-Rails: Can reach both Entrance Stairs-Number of Steps: 7 Home Layout: One level Home Equipment: None      Prior Function Level of Independence: Independent               Hand Dominance   Dominant Hand: Right    Extremity/Trunk Assessment   Upper Extremity Assessment Upper Extremity Assessment: Overall WFL for tasks assessed    Lower Extremity Assessment Lower Extremity Assessment: Overall WFL for tasks assessed    Cervical / Trunk Assessment Cervical / Trunk Assessment: Other exceptions (back surgery)  Communication   Communication: No difficulties  Cognition Arousal/Alertness: Awake/alert Behavior During Therapy: Flat affect Overall Cognitive Status: Within Functional Limits for tasks assessed  General Comments      Exercises     Assessment/Plan    PT Assessment Patient needs continued PT services  PT Problem List Decreased activity tolerance;Decreased balance;Decreased mobility;Decreased knowledge of precautions       PT Treatment Interventions DME instruction;Gait training;Stair training;Functional mobility training;Therapeutic  activities;Therapeutic exercise;Balance training;Patient/family education    PT Goals (Current goals can be found in the Care Plan section)  Acute Rehab PT Goals Patient Stated Goal: to go home  PT Goal Formulation: With patient Time For Goal Achievement: 05/07/17 Potential to Achieve Goals: Good    Frequency Min 5X/week   Barriers to discharge        Co-evaluation               AM-PAC PT "6 Clicks" Daily Activity  Outcome Measure Difficulty turning over in bed (including adjusting bedclothes, sheets and blankets)?: A Little Difficulty moving from lying on back to sitting on the side of the bed? : A Little Difficulty sitting down on and standing up from a chair with arms (e.g., wheelchair, bedside commode, etc,.)?: A Little Help needed moving to and from a bed to chair (including a wheelchair)?: A Little Help needed walking in hospital room?: A Little Help needed climbing 3-5 steps with a railing? : A Little 6 Click Score: 18    End of Session Equipment Utilized During Treatment: Gait belt Activity Tolerance: Patient tolerated treatment well Patient left: in chair;with call bell/phone within reach;with family/visitor present Nurse Communication: Mobility status PT Visit Diagnosis: Unsteadiness on feet (R26.81)    Time: 6045-4098 PT Time Calculation (min) (ACUTE ONLY): 17 min   Charges:   PT Evaluation $PT Re-evaluation: 1 Procedure     PT G Codes:        Charlotte Crumb, PT DPT  [phone]   Fabio Asa 05/01/2017, 10:14 AM

## 2017-05-01 NOTE — Progress Notes (Signed)
Pt. Has had 1 small BM today, prior to abd x-ray. Pt. Now agreeable to take Mag Citrate. 1 bottle administered.

## 2017-05-01 NOTE — Progress Notes (Signed)
Physical Therapy Treatment Patient Details Name: Sergio BeaversMiguel Boyd MRN: [id number] DOB: 01/02/1971 Today's Date: 05/01/2017    History of Present Illness patient is a 46 yo male s/p Re-operative laminectomy right L5-S1, left L5-S1 laminectomy for decompression; repair of pseudomeningocele. S/p drain placement.     PT Comments    Pt demonstrating improved gait this session with ability to ambulate ~17350ft without AD. Pt continues to be guarded with decreased cadence and gait speed due to pain. Pt complaining of neck and upper trap pain during session with PT providing education about recovery time and improving mobility to address stiffness. Pt would benefit from continued acute PT to improve gait and maximize independence, but no follow up PT recommended upon d/c.   Follow Up Recommendations   No PT follow up     Equipment Recommendations  None recommended by PT    Recommendations for Other Services       Precautions / Restrictions Precautions Precautions: Back Precaution Comments: pt able to recall 3/3 precautions Restrictions Weight Bearing Restrictions: No    Mobility  Bed Mobility               General bed mobility comments: received sitting EOB   Transfers Overall transfer level: Modified independent Equipment used: Rolling walker (2 wheeled) Transfers: Sit to/from Stand Sit to Stand: Modified independent (Device/Increase time)         General transfer comment: increased time due to pain. Pt used RW during sit <> stand for support  Ambulation/Gait Ambulation/Gait assistance: Modified independent (Device/Increase time) Ambulation Distance (Feet): 150 Feet Assistive device: None Gait Pattern/deviations: Step-through pattern;Decreased stride length Gait velocity: decreased Gait velocity interpretation: Below normal speed for age/gender General Gait Details: decreased gait speed and cadence due to pain.    Stairs            Wheelchair Mobility     Modified Rankin (Stroke Patients Only)       Balance Overall balance assessment: No apparent balance deficits (not formally assessed)                                          Cognition Arousal/Alertness: Awake/alert Behavior During Therapy: Flat affect Overall Cognitive Status: Within Functional Limits for tasks assessed                                        Exercises      General Comments General comments (skin integrity, edema, etc.): pt very concerned about neck pain and inquires about outpatient PT. PT provided education about activity restrictions due to recent surgery. Recommended pt continue to monitor symptoms and continue with gait to improve activity tolerance and decrease stiffness      Pertinent Vitals/Pain Pain Assessment: Faces Faces Pain Scale: Hurts even more Pain Location: cervical spine radiating into traps and B shoulders Pain Descriptors / Indicators: Aching;Discomfort;Sore Pain Intervention(s): Monitored during session    Home Living                      Prior Function            PT Goals (current goals can now be found in the care plan section) Acute Rehab PT Goals Patient Stated Goal: to go home PT Goal Formulation: With family Time For Goal Achievement: 05/07/17  Potential to Achieve Goals: Good Progress towards PT goals: Progressing toward goals    Frequency    Min 5X/week      PT Plan Current plan remains appropriate    Co-evaluation              AM-PAC PT "6 Clicks" Daily Activity  Outcome Measure  Difficulty turning over in bed (including adjusting bedclothes, sheets and blankets)?: A Little Difficulty moving from lying on back to sitting on the side of the bed? : A Little Difficulty sitting down on and standing up from a chair with arms (e.g., wheelchair, bedside commode, etc,.)?: A Little Help needed moving to and from a bed to chair (including a wheelchair)?: A Little Help  needed walking in hospital room?: A Little Help needed climbing 3-5 steps with a railing? : A Little 6 Click Score: 18    End of Session Equipment Utilized During Treatment: Gait belt Activity Tolerance: Patient tolerated treatment well Patient left: in bed;with call bell/phone within reach;with family/visitor present Nurse Communication: Mobility status PT Visit Diagnosis: Unsteadiness on feet (R26.81)     Time: 1191-4782 PT Time Calculation (min) (ACUTE ONLY): 13 min  Charges:  $Gait Training: 8-22 mins                    G CodesCorlis Leak, SPT  [phone]    Corlis Leak 05/01/2017, 3:52 PM

## 2017-05-01 NOTE — Progress Notes (Signed)
Received call that patient was having "chest pain" Went up immediately to evaluate pt Reports right sided chest pain without radiation Started when he got up from bed and sat in chair Describes it as a sharp pain that goes into his back  Had a similar pain yesterday in his lower stomach that resolved with protonix Endorses dysphagia. Denies dyspnea, left arm pain, neck pain  On exam, he is nontoxic in appearance. No diaphoresis.  Vitals: Slight tachycardia <110, 100% O2, normotensive He has exquisite TTP right costochrondral joints. Reports this is the pain he is feeling No LE swelling. Neg Homan's  Plan Chest pain: history and exam more consistent with reflux and M/S in origin Exam and vitals not consistent with PE He was given Protonix while I was present in the room. Reports this has helped with some of his pain Was given ice for the chest wall pain Will continue to monitor Nursing to call with update

## 2017-05-02 NOTE — Progress Notes (Signed)
Constipation resolved Headache and neck pain persist Lumbar incision c/d/i and flat, no evidence of recurrent pseudomeningocele Brain and cervical MRI s contrast Consult neurology for headache management if imaging not helpful

## 2017-05-02 NOTE — Progress Notes (Addendum)
Physical Therapy Treatment Patient Details Name: Sergio BeaversMiguel Boyd MRN: [id number] DOB: November 26, 1971 Today's Date: 05/02/2017    History of Present Illness Patient is a 46 yo male s/p re-operative laminectomy right L5-S1, left L5-S1 laminectomy for decompression; repair of pseudomeningocele. S/p drain placement.     PT Comments    Pt continues to be limited by cervical pain, extending into head, shoulders, and rhomboids. Tolerated 15 minutes of soft tissue massage and noted several trigger points that softened with treatment. Pt reports improvement in pain. Encouraged AROM as tolerated in cervical spine. Will continue to follow and progress as able per POC.   Follow Up Recommendations  No PT follow up     Equipment Recommendations  None recommended by PT    Recommendations for Other Services       Precautions / Restrictions Precautions Precautions: Fall;Back Precaution Booklet Issued: Yes (comment) Precaution Comments: pt able to recall 3/3 precautions Restrictions Weight Bearing Restrictions: No    Mobility  Bed Mobility Overal bed mobility: Modified Independent Bed Mobility: Rolling;Sidelying to Sit;Sit to Sidelying           General bed mobility comments: Good form; no assist required.   Transfers Overall transfer level: Modified independent Equipment used: None Transfers: Sit to/from Stand           General transfer comment: No assist required; no unsteadiness noted; good posture throughout transfers  Ambulation/Gait             General Gait Details: Only ambulated ~5 feet in room due to extreme pain in cervical spine and shoulders.    Stairs            Wheelchair Mobility    Modified Rankin (Stroke Patients Only)       Balance Overall balance assessment: No apparent balance deficits (not formally assessed)                                          Cognition Arousal/Alertness: Awake/alert Behavior During Therapy: Flat  affect Overall Cognitive Status: Within Functional Limits for tasks assessed                                        Exercises      General Comments General comments (skin integrity, edema, etc.): Due to significant muscular pain at this time, pt agreeable to soft tissue work in cervical spine, upper traps, and into rhomboids. Noted several muscular trigger points which softened throughout treatment. ~15 minutes total of intervention. Pt was repositioned with towel roll under neck, UE's supported, and instant heat packs on neck and shoulders. Pt reports improvement in pain.       Pertinent Vitals/Pain Pain Assessment: Faces Faces Pain Scale: Hurts whole lot Pain Location: cervical spine radiating into traps and B shoulders Pain Descriptors / Indicators: Aching;Cramping;Sore;Spasm Pain Intervention(s): Limited activity within patient's tolerance;Monitored during session;Repositioned;Premedicated before session;Heat applied    Home Living                      Prior Function            PT Goals (current goals can now be found in the care plan section) Acute Rehab PT Goals Patient Stated Goal: to go home PT Goal Formulation: With family Time For Goal Achievement: 05/07/17  Potential to Achieve Goals: Good Progress towards PT goals: Progressing toward goals    Frequency    Min 5X/week      PT Plan Current plan remains appropriate    Co-evaluation              AM-PAC PT "6 Clicks" Daily Activity  Outcome Measure  Difficulty turning over in bed (including adjusting bedclothes, sheets and blankets)?: None Difficulty moving from lying on back to sitting on the side of the bed? : None Difficulty sitting down on and standing up from a chair with arms (e.g., wheelchair, bedside commode, etc,.)?: None Help needed moving to and from a bed to chair (including a wheelchair)?: A Little Help needed walking in hospital room?: A Little Help needed  climbing 3-5 steps with a railing? : A Little 6 Click Score: 21    End of Session   Activity Tolerance: Patient limited by pain Patient left: in bed;with call bell/phone within reach;with family/visitor present Nurse Communication: Mobility status PT Visit Diagnosis: Other abnormalities of gait and mobility (R26.89);Muscle weakness (generalized) (M62.81);Pain Pain - part of body:  (Neck/ Bilateral shoulders)     Time: 1610-9604 PT Time Calculation (min) (ACUTE ONLY): 27 min  Charges:  $Therapeutic Activity: 8-22 mins $Massage: 8-22 mins                    G Codes:       Conni Slipper, PT, DPT Acute Rehabilitation Services Pager: [phone]    Marylynn Pearson 05/02/2017, 11:36 AM

## 2017-05-02 NOTE — Progress Notes (Addendum)
Patient expressing concern over bowel medications--although refused at this time miralax.  Discussed in detail medications patient was currently taking and PRN medications.  Patient did not express further questions at this time. Continue to monitor and encouraged patient to discuss needs/concerns with MD/PA team each day.

## 2017-05-02 NOTE — Progress Notes (Signed)
Pt seen and examined.  No issues overnight. Did have a BM yesterday and this morning Chest pain from yesterday resolved with tx given Continues to have HA and neck pain x1 month. Neg photophobia, fever  EXAM: Temp:  [98.2 F (36.8 C)-98.8 F (37.1 C)] 98.8 F (37.1 C) (05/23 1028) Pulse Rate:  [77-98] 79 (05/23 1028) Resp:  [18-20] 20 (05/23 1028) BP: (100-132)/(69-90) 100/81 (05/23 1028) SpO2:  [99 %-100 %] 99 % (05/23 1028) Intake/Output      05/22 0701 - 05/23 0700 05/23 0701 - 05/24 0700   P.O.  240   Total Intake(mL/kg)  240 (2.6)   Urine (mL/kg/hr) 620 (0.3) 300 (0.7)   Stool 0 (0) 0 (0)   Total Output 620 300   Net -620 -60        Urine Occurrence 3 x 1 x   Stool Occurrence 2 x 1 x    Awake and alert Follows commands throughout Full strength Wound flat, c/d/i  PLAN Continues to be doing well from surgery. Wound without evidence of recurrent CSF leak. Constipation: resolved HA/Neck pain: Discussed with BJD. Unclear etiology. Obtain Cspine MRI and brain MRI for further eval. Consult neuro pending imaging for HA management

## 2017-05-02 NOTE — Progress Notes (Signed)
Occupational Therapy Treatment Patient Details Name: Sergio Boyd MRN: [id number] DOB: 1971/07/13 Today's Date: 05/02/2017    History of present illness Patient is a 46 yo male s/p re-operative laminectomy right L5-S1, left L5-S1 laminectomy for decompression; repair of pseudomeningocele. S/p drain placement.    OT comments  Pt progressing towards goals. Pt declining OOB mobility this session as he is experiencing increased pain in cervical region and head, completed EOB ADLs with MinA for LB ADLs. Session included education on AE and compensatory techniques for completing ADLs while adhering to back precautions with Pt verbalizing and demonstrating understanding use of AE. Required Min verbal cues for adhering to back precautions during session. Pt will benefit from continued OT services while in acute setting to maximize safety and independence with ADLs and functional mobility. Will continue to follow.    Follow Up Recommendations  No OT follow up;Supervision - Intermittent    Equipment Recommendations  3 in 1 bedside commode          Precautions / Restrictions Precautions Precautions: Fall;Back Precaution Comments: pt able to recall 3/3 precautions Restrictions Weight Bearing Restrictions: No       Mobility Bed Mobility Overal bed mobility: Needs Assistance Bed Mobility: Rolling;Sidelying to Sit;Sit to Sidelying Rolling: Supervision Sidelying to sit: Supervision     Sit to sidelying: Supervision General bed mobility comments: Good form using log roll technique  Transfers                 General transfer comment: Pt declining OOB mobility this session due to increase pain in cervical region and headache; agreeable to EOB ADL tasks; required verbal cues x2 times during session to adhere to back precautions (attempting to twist while sitting EOB)    Balance     Sitting balance-Leahy Scale: Good                                     ADL either  performed or assessed with clinical judgement   ADL Overall ADL's : Needs assistance/impaired Eating/Feeding: Set up;Sitting             Lower Body Bathing Details (indicate cue type and reason): educated Pt on AE and compensatory techniques for completing task in order to increase safety and adhere to back precautions      Lower Body Dressing: Min guard;Sit to/from stand;With adaptive equipment Lower Body Dressing Details (indicate cue type and reason): educated Pt on AE for completing LB dressing with Pt verbalizing understanding and return demonstrating; Pt demonstrates able to complete figure 4 technique to perform LB dressing as well         Tub/ Shower Transfer: Education officer, environmental Details (indicate cue type and reason): Pt declined practicing shower transfer this session, educated on technique for safely performing transfer while adhering to back precautions   General ADL Comments: Reviewed and educated Pt on compensatory techniques and AE for completing ADLs while adhering to back precautions with Pt demonstrating and verbalizing good understanding. Pt continues to have increased neck pain and headache, declining OOB mobility this session but agreeable to EOB ADL tasks, sitting EOB approx 20 min at supervision level.                        Cognition Arousal/Alertness: Awake/alert Behavior During Therapy: Flat affect Overall Cognitive Status: Within Functional Limits for tasks assessed  General Comments      Pertinent Vitals/ Pain       Pain Assessment: Faces Faces Pain Scale: Hurts whole lot Pain Location: cervical spine radiating into traps and B shoulders; headache Pain Descriptors / Indicators: Aching;Cramping;Sore;Spasm;Headache Pain Intervention(s): Limited activity within patient's tolerance;Monitored during session;Repositioned  Home Living                                           Prior Functioning/Environment              Frequency  Min 2X/week        Progress Toward Goals  OT Goals(current goals can now be found in the care plan section)  Progress towards OT goals: Progressing toward goals  Acute Rehab OT Goals Patient Stated Goal: to go home OT Goal Formulation: With patient Time For Goal Achievement: 05/07/17 Potential to Achieve Goals: Good  Plan Discharge plan remains appropriate    Co-evaluation                 AM-PAC PT "6 Clicks" Daily Activity     Outcome Measure   Help from another person eating meals?: None Help from another person taking care of personal grooming?: A Little Help from another person toileting, which includes using toliet, bedpan, or urinal?: A Little Help from another person bathing (including washing, rinsing, drying)?: A Little Help from another person to put on and taking off regular upper body clothing?: A Little Help from another person to put on and taking off regular lower body clothing?: A Little 6 Click Score: 19    End of Session    OT Visit Diagnosis: Unsteadiness on feet (R26.81);Pain Pain - part of body:  (back, neck )   Activity Tolerance Patient tolerated treatment well;Patient limited by pain (tolerated session EOB; pain limiting OOB mobility)   Patient Left with call bell/phone within reach;in bed             Time: [id number] OT Time Calculation (min): 32 min  Charges: OT General Charges $OT Visit: 1 Procedure OT Treatments $Self Care/Home Management : 23-37 mins  Sergio Boyd, OT Pager [phone] 05/02/2017    Sergio Boyd 05/02/2017, 4:43 PM

## 2017-05-03 ENCOUNTER — Inpatient Hospital Stay (HOSPITAL_COMMUNITY): Payer: BLUE CROSS/BLUE SHIELD

## 2017-05-03 DIAGNOSIS — M79609 Pain in unspecified limb: Secondary | ICD-10-CM

## 2017-05-03 MED ORDER — SUMATRIPTAN SUCCINATE 25 MG PO TABS
25.0000 mg | ORAL_TABLET | ORAL | Status: DC | PRN
  Administered 2017-05-03 – 2017-05-05 (×5): 25 mg via ORAL
  Filled 2017-05-03 (×9): qty 1

## 2017-05-03 NOTE — Progress Notes (Signed)
Physical Therapy Treatment Patient Details Name: Sergio BeaversMiguel Boyd MRN: [id number] DOB: [birth year] Today's Date: 05/03/2017    History of Present Illness Patient is a 46 yo male s/p re-operative laminectomy right L5-S1, left L5-S1 laminectomy for decompression; repair of pseudomeningocele. S/p drain placement.     PT Comments    Pt making good progress with mobility, tolerating ambulation in hallway with supervision and no AD. Pt continues to report 10/10 pain in neck and headache. Pt stated that his RN and MD are aware. PT will continue to follow acutely to ensure a safe d/c home.  Follow Up Recommendations  No PT follow up     Equipment Recommendations  None recommended by PT    Recommendations for Other Services       Precautions / Restrictions Precautions Precautions: Fall;Back Restrictions Weight Bearing Restrictions: No    Mobility  Bed Mobility Overal bed mobility: Needs Assistance Bed Mobility: Rolling;Sidelying to Sit;Sit to Sidelying Rolling: Supervision Sidelying to sit: Supervision     Sit to sidelying: Supervision General bed mobility comments: Good form using log roll technique  Transfers Overall transfer level: Modified independent Equipment used: None                Ambulation/Gait Ambulation/Gait assistance: Supervision Ambulation Distance (Feet): 600 Feet Assistive device: None Gait Pattern/deviations: Step-through pattern;Decreased stride length Gait velocity: decreased Gait velocity interpretation: Below normal speed for age/gender General Gait Details: pt ambulated in hallway with supervision, slow, steady gait with no instability or LOB, supervision for safety   Stairs            Wheelchair Mobility    Modified Rankin (Stroke Patients Only)       Balance Overall balance assessment: Needs assistance Sitting-balance support: Feet supported Sitting balance-Leahy Scale: Good     Standing balance support: During functional  activity;No upper extremity supported Standing balance-Leahy Scale: Good                              Cognition Arousal/Alertness: Awake/alert Behavior During Therapy: Flat affect Overall Cognitive Status: Within Functional Limits for tasks assessed                                        Exercises      General Comments        Pertinent Vitals/Pain Pain Assessment: 0-10 Pain Score: 10-Worst pain ever Pain Location: cervical spine and headache Pain Descriptors / Indicators: Aching;Cramping;Sore;Spasm;Headache Pain Intervention(s): Monitored during session;Repositioned    Home Living                      Prior Function            PT Goals (current goals can now be found in the care plan section) Acute Rehab PT Goals PT Goal Formulation: With family Time For Goal Achievement: 05/07/17 Potential to Achieve Goals: Good Progress towards PT goals: Progressing toward goals    Frequency    Min 5X/week      PT Plan Current plan remains appropriate    Co-evaluation              AM-PAC PT "6 Clicks" Daily Activity  Outcome Measure  Difficulty turning over in bed (including adjusting bedclothes, sheets and blankets)?: None Difficulty moving from lying on back to sitting on the side of the bed? :  None Difficulty sitting down on and standing up from a chair with arms (e.g., wheelchair, bedside commode, etc,.)?: None Help needed moving to and from a bed to chair (including a wheelchair)?: A Little Help needed walking in hospital room?: A Little Help needed climbing 3-5 steps with a railing? : A Little 6 Click Score: 21    End of Session   Activity Tolerance: Patient tolerated treatment well Patient left: in bed;with call bell/phone within reach Nurse Communication: Mobility status PT Visit Diagnosis: Other abnormalities of gait and mobility (R26.89);Muscle weakness (generalized) (M62.81);Pain Pain - part of body:  (cervical  spine)     Time: 1610-9604 PT Time Calculation (min) (ACUTE ONLY): 17 min  Charges:  $Gait Training: 8-22 mins                    G Codes:       [place], [place], [place] [phone]    Alessandra Bevels Sophina Mitten 05/03/2017, 10:42 AM

## 2017-05-03 NOTE — Progress Notes (Signed)
Continues to complain of headaches and neck pain Also complaining of right calf pain Incision c/d/i and flat Moving legs well MRI head and cervical spine unrevealing Will check dopplers and d/c this afternoon if negative

## 2017-05-03 NOTE — Progress Notes (Signed)
VASCULAR LAB PRELIMINARY  PRELIMINARY  PRELIMINARY  PRELIMINARY  Bilateral lower extremity venous duplex completed.    Preliminary report:  There is no obvious evidence of DVT or SVT noted in the bilateral lower extremities.  Sluggish flow noted in the left popliteal vein.  Elishua Radford, RVT 05/03/2017, 3:05 PM

## 2017-05-03 NOTE — Progress Notes (Signed)
Patient slept well throughout night, although had many questions at beginning of shift regarding medications--pt encouraged and pt requesting to talk more with MD team regarding medication list.  C/o headache and neck pain--"muscle cramps" continue per patient. Continue to monitor.

## 2017-05-04 NOTE — Progress Notes (Signed)
No issues overnight. Pt cont to c/o HA and neck pain, unchanged.  EXAM:  BP (!) 109/54 (BP Location: Right Arm)   Pulse (!) 55   Temp 99 F (37.2 C) (Oral)   Resp 18   Ht 5\' 10"  (1.778 m)   Wt 90.7 kg (200 lb)   SpO2 100%   BMI 28.70 kg/m   Awake, alert, oriented  Speech fluent, appropriate  CN grossly intact  5/5 BUE/BLE  Back wound dry  IMPRESSION:  46 y.o. male s/p repair pseudomeningocele. HA and neck pain likely result of the CSF leak. MRI brain/Cspine unremarkable. I suspect we will have to give him time for the HA and neck pain to resolve.  PLAN: - Would appreciate neurology input regarding optimization of current headache regimen - Can likely be d/c home after neurology consult

## 2017-05-04 NOTE — Progress Notes (Signed)
Late entry from last night: Pt was seen after BLE VUS was performed - negative for DVT Still complains of 10/10 Headache and neck pain x2.5 months. MRI brain/neck unremarkable.  Consulted neuro last night. Will try again today He is cleared from NS standpoint.

## 2017-05-04 NOTE — Progress Notes (Signed)
PT Cancellation Note  Patient Details Name: Sergio BeaversMiguel Boyd MRN: [id number] DOB: 1971-05-05   Cancelled Treatment:    Reason Eval/Treat Not Completed: Patient declined, no reason specified. Pt currently eating dinner and requesting to defer ambulation at this time. Pt reported that he has been ambulating within his room independently. PT will continue to f/u with pt as available.   Alessandra BevelsJennifer M Altus Zaino 05/04/2017, 5:01 PM

## 2017-05-05 DIAGNOSIS — R51 Headache: Secondary | ICD-10-CM

## 2017-05-05 MED ORDER — PROCHLORPERAZINE EDISYLATE 5 MG/ML IJ SOLN
10.0000 mg | Freq: Once | INTRAMUSCULAR | Status: AC
  Administered 2017-05-05: 10 mg via INTRAVENOUS
  Filled 2017-05-05: qty 2

## 2017-05-05 MED ORDER — DEXAMETHASONE SODIUM PHOSPHATE 4 MG/ML IJ SOLN
4.0000 mg | Freq: Four times a day (QID) | INTRAMUSCULAR | Status: AC
  Administered 2017-05-05 – 2017-05-08 (×12): 4 mg via INTRAVENOUS
  Filled 2017-05-05 (×12): qty 1

## 2017-05-05 MED ORDER — VALPROATE SODIUM 500 MG/5ML IV SOLN
1000.0000 mg | Freq: Once | INTRAVENOUS | Status: AC
  Administered 2017-05-05: 1000 mg via INTRAVENOUS
  Filled 2017-05-05: qty 10

## 2017-05-05 MED ORDER — KETOROLAC TROMETHAMINE 30 MG/ML IJ SOLN
30.0000 mg | Freq: Once | INTRAMUSCULAR | Status: AC
  Administered 2017-05-05: 30 mg via INTRAVENOUS
  Filled 2017-05-05: qty 1

## 2017-05-05 NOTE — Progress Notes (Addendum)
Occupational Therapy Treatment/Discharge Patient Details Name: Sergio Boyd MRN: [id number] DOB: 01-Aug-1971 Today's Date: 05/05/2017    History of present illness Patient is a 46 yo male s/p re-operative laminectomy right L5-S1, left L5-S1 laminectomy for decompression; repair of pseudomeningocele. S/p drain placement.    OT comments  Pt progressing well toward OT goals. Completed education concerning safe shower transfers while adhering to back precautions as well as concerning set-up to prevent bending to obtain self-care items. Additionally educated pt on use of 3-in-1 over toilet to improve ability to adhere to back precautions while rising to stand utilizing arm rests. Pt able to complete shower and toilet transfers with modified independence this session. All education complete concerning back precautions and safety during ADL post-operatively. Pt remains concerned over his neck and headache pain and educated pt on importance of relaxation and functional mobility including participation in daily ADL routine to improve this. No further acute OT needs identified. OT will sign off.    Follow Up Recommendations  No OT follow up;Supervision - Intermittent    Equipment Recommendations  3 in 1 bedside commode    Recommendations for Other Services      Precautions / Restrictions Precautions Precautions: Fall;Back Precaution Booklet Issued: Yes (comment) Precaution Comments: pt able to recall 3/3 precautions Restrictions Weight Bearing Restrictions: No       Mobility Bed Mobility Overal bed mobility: Needs Assistance Bed Mobility: Rolling;Sidelying to Sit;Sit to Sidelying Rolling: Supervision Sidelying to sit: Supervision     Sit to sidelying: Supervision General bed mobility comments: VC's for technique. Pt twisting in bed on OT arrival.  Transfers Overall transfer level: Modified independent Equipment used: None Transfers: Sit to/from Stand Sit to Stand: Modified independent  (Device/Increase time)              Balance Overall balance assessment: Needs assistance Sitting-balance support: Feet supported Sitting balance-Leahy Scale: Good     Standing balance support: During functional activity;No upper extremity supported Standing balance-Leahy Scale: Good                             ADL either performed or assessed with clinical judgement   ADL Overall ADL's : Needs assistance/impaired                         Toilet Transfer: Ambulation;Modified Independent   Toileting- Clothing Manipulation and Hygiene: Modified independent;Sit to/from stand   Tub/ Shower Transfer: Modified independent;Walk-in shower;3 in 1;Ambulation   Functional mobility during ADLs: Modified independent General ADL Comments: Educated pt on use of 3-in-1 over toilet in order to improve safety and decrease pain. Pt thankful for education but but reports that he is unsure if he will implement this technique. Pt able to complete toilet and shower transfers with modified independence. Educated pt on setting up shower to prevent bending to gather supplies.      Vision   Vision Assessment?: No apparent visual deficits   Perception     Praxis      Cognition Arousal/Alertness: Awake/alert Behavior During Therapy: Flat affect Overall Cognitive Status: Within Functional Limits for tasks assessed                                          Exercises     Shoulder Instructions       General Comments  Pt remains concerned about neck pain and asked therapist if stretching method he discovered online would be beneficial. Strongly discouraged this activity and explained to pt that this places spine in compromising twisting positions post-operatively. Educated pt on relaxation techniques and increasing activity little by little to improve mobility and decrease pain.    Pertinent Vitals/ Pain       Pain Assessment: 0-10 Pain Score: 10-Worst pain  ever (Smiling throughout session) Pain Location: cervical spine and headache Pain Descriptors / Indicators: Aching;Cramping;Sore;Spasm;Headache Pain Intervention(s): Monitored during session;Repositioned;RN gave pain meds during session  Home Living                                          Prior Functioning/Environment              Frequency  Min 2X/week        Progress Toward Goals  OT Goals(current goals can now be found in the care plan section)  Progress towards OT goals: Progressing toward goals  Acute Rehab OT Goals Patient Stated Goal: decrease neck pain OT Goal Formulation: With patient Time For Goal Achievement: 05/07/17 Potential to Achieve Goals: Good ADL Goals Pt Will Perform Lower Body Bathing: with modified independence;sit to/from stand Pt Will Perform Lower Body Dressing: with modified independence;sit to/from stand;with adaptive equipment (with or without AE) Pt Will Transfer to Toilet: with modified independence;ambulating;bedside commode (BSC over toilet) Pt Will Perform Toileting - Clothing Manipulation and hygiene: with modified independence;sit to/from stand Pt Will Perform Tub/Shower Transfer: Shower transfer;with modified independence;ambulating;3 in 1 Additional ADL Goal #1: Pt will independently verbalize understanding of 3/3 back precautions for ADL and funcitonal mobility tasks.  Plan Discharge plan remains appropriate    Co-evaluation                 AM-PAC PT "6 Clicks" Daily Activity     Outcome Measure   Help from another person eating meals?: None Help from another person taking care of personal grooming?: None Help from another person toileting, which includes using toliet, bedpan, or urinal?: None Help from another person bathing (including washing, rinsing, drying)?: A Little Help from another person to put on and taking off regular upper body clothing?: None Help from another person to put on and taking  off regular lower body clothing?: A Little 6 Click Score: 22    End of Session    OT Visit Diagnosis: Unsteadiness on feet (R26.81);Pain Pain - part of body:  (back)   Activity Tolerance Patient tolerated treatment well   Patient Left with call bell/phone within reach;in bed   Nurse Communication Mobility status        Time: [id number] OT Time Calculation (min): 22 min  Charges: OT General Charges $OT Visit: 1 Procedure OT Treatments $Self Care/Home Management : 8-22 mins  Doristine Sectionharity A Jesselyn Rask, MS OTR/L  Pager: [phone]    Kimball Manske A Oria Klimas 05/05/2017, 2:36 PM

## 2017-05-05 NOTE — Progress Notes (Signed)
Vitals:   05/04/17 2052 05/05/17 0103 05/05/17 0453 05/05/17 0922  BP: (!) 101/56 108/62 121/79 (!) 101/57  Pulse: (!) 58 (!) 55 81 (!) 57  Resp: 20 20 20 18   Temp: 98.1 F (36.7 C) 98 F (36.7 C) 97.8 F (36.6 C) 98.2 F (36.8 C)  TempSrc: Oral Oral Oral Oral  SpO2: 100% 100% 99% 100%  Weight:      Height:        Patient continues to complain of disabling position all headache. Some headache and neck pain when supine, but worsens significantly with sitting, standing, and walking. Wound examined. Healing nicely. No erythema, swelling, or drainage.  Plan: Continuing supportive care.  Hewitt ShortsNUDELMAN,ROBERT W, MD 05/05/2017, 9:52 AM

## 2017-05-05 NOTE — Plan of Care (Signed)
Problem: Education: Goal: Knowledge of [facility] Education information/materials will improve Outcome: Progressing POC reviewed with pt.   

## 2017-05-05 NOTE — Progress Notes (Signed)
Vitals:   05/04/17 2052 05/05/17 0103 05/05/17 0453 05/05/17 0922  BP: (!) 101/56 108/62 121/79 (!) 101/57  Pulse: (!) 58 (!) 55 81 (!) 57  Resp: 20 20 20 18   Temp: 98.1 F (36.7 C) 98 F (36.7 C) 97.8 F (36.6 C) 98.2 F (36.8 C)  TempSrc: Oral Oral Oral Oral  SpO2: 100% 100% 99% 100%  Weight:      Height:        Case discussed with Dr. Onalee HuaMcNeil Kirkpatrick from neurology. We both feel the patient continues to have positional headache. I wonder whether there is some residual meningeal irritation might benefit from a course of Decadron. he agrees that that may be helpful.  Plan: Decadron 4 mg IV every 6 hours 12 doses ordered.  Hewitt ShortsNUDELMAN,ROBERT W, MD 05/05/2017, 10:26 AM

## 2017-05-05 NOTE — Consult Note (Signed)
Neurology Consultation Reason for Consult: Headache Referring Physician: Dereck LeepNundkumar, Neelish  CC: Headache  History is obtained from:Patient   HPI: Sergio Boyd is a 46 y.o. male with a history of lumbar surgery with subsequent pseudomeningocele CSF leak repair of pseudomeningocele placement of lumbar drain. Drain was clamped on the 19th with subsequent worsening of his headaches and this led to question of whether the headaches were related to the leak or not.   The patient states that his current headaches are very similar to his earlier headaches that were present when he was having a CSF leak. He does have a history of occasional headaches lasting 1-2 hours with photophobia, but never anything like this either in character or severity.  He states that his headaches are significantly better when laying down, but he does still have some headache even in the prone position. Changes in position while prone do not affect the headache, but sitting up or standing do make it significantly worse. He states that he is also having significant pain in his neck as well.  ROS: A 14 point ROS was performed and is negative except as noted in the HPI.   Past Medical History:  Diagnosis Date  . Arthritis   . Chronic back pain    herniated disc  . GERD (gastroesophageal reflux disease)   . Headache   . History of migraine   . Joint pain      History reviewed. No pertinent family history.   Social History:  reports that he has been smoking.  He has a 1.00 pack-year smoking history. He has never used smokeless tobacco. He reports that he does not drink alcohol or use drugs.   Exam: Current vital signs: BP (!) 101/57 (BP Location: Right Arm)   Pulse (!) 57   Temp 98.2 F (36.8 C) (Oral)   Resp 18   Ht 5\' 10"  (1.778 m)   Wt 90.7 kg (200 lb)   SpO2 100%   BMI 28.70 kg/m  Vital signs in last 24 hours: Temp:  [97.4 F (36.3 C)-98.2 F (36.8 C)] 98.2 F (36.8 C) (05/26 19140922) Pulse Rate:   [55-81] 57 (05/26 0922) Resp:  [18-20] 18 (05/26 0922) BP: (101-121)/(54-79) 101/57 (05/26 0922) SpO2:  [99 %-100 %] 100 % (05/26 [id number])   Physical Exam  Constitutional: Appears well-developed and well-nourished.  Psych: Affect appropriate to situation Eyes: No scleral injection HENT: No OP obstrucion Head: Normocephalic.  Cardiovascular: Normal rate and regular rhythm.  Respiratory: Effort normal and breath sounds normal to anterior ascultation GI: Soft.  No distension. There is no tenderness.  Skin: WDI  Neuro: Mental Status: Patient is awake, alert, oriented to person, place, month, year, and situation. Patient is able to give a clear and coherent history. No signs of aphasia or neglect Cranial Nerves: II: Visual Fields are full. Pupils are equal, round, and reactive to light.   III,IV, VI: EOMI without ptosis or diploplia.  V: Facial sensation is symmetric to temperature VII: Facial movement is symmetric.  VIII: hearing is intact to voice X: Uvula elevates symmetrically XI: Shoulder shrug is symmetric. XII: tongue is midline without atrophy or fasciculations.  Motor: Tone is normal. Bulk is normal. 5/5 strength was present in bilateral upper extremity is, he is limited significantly in pain all testing lower extremities but no clear deficit was found on my exam. Sensory: Sensation is symmetric to light touch  Cerebellar: Finger nose finger intact bilaterally   I have reviewed labs in epic and the  results pertinent to this consultation are: MRI brain-normal  I have reviewed the images obtained: MRI brain-no acute findings  Impression: 46 year old male with persistent positional headache following CSF leak repair. The description of the headache does seem most consistent with a low pressure headache. He is on Diamox, I do wonder if this could be contributing. I spoke with Dr. Penelope Galas, and there is some concern that it may be persistent meningeal irritation as well and  he has started IV steroids which I think is reasonable.  Though I think migraine is much less likely, he'll be reasonable to try Compazine/Toradol/Depakote to see if there is some benefit. I don't think that daily sumatriptan is likely to be helpful and I have discontinued this.  Recommendations: 1) Compazine/Toradol/Depakote 2) discontinue Acetazolamide 3) discontinue sumatriptan 4) if no improvement, may need further evaluation for persistent CSF leak.   Ritta Slot, MD [facility] [phone]  If 7pm- 7am, please page neurology on call as listed in AMION.

## 2017-05-06 MED ORDER — PROCHLORPERAZINE EDISYLATE 5 MG/ML IJ SOLN
10.0000 mg | Freq: Four times a day (QID) | INTRAMUSCULAR | Status: DC | PRN
  Administered 2017-05-08: 10 mg via INTRAVENOUS
  Filled 2017-05-06: qty 2

## 2017-05-06 MED ORDER — ZOLPIDEM TARTRATE 5 MG PO TABS
5.0000 mg | ORAL_TABLET | Freq: Every evening | ORAL | Status: DC | PRN
  Administered 2017-05-06 – 2017-05-07 (×2): 5 mg via ORAL
  Filled 2017-05-06 (×2): qty 1

## 2017-05-06 NOTE — Progress Notes (Addendum)
Pt seen and examined.  No issues overnight. Still complains of 10/10 headache Difficulties sleeping at night. Would like something to help Able to ambulate in the hallways freely Tolerating po  EXAM: Temp:  [97.4 F (36.3 C)-98.3 F (36.8 C)] 98.2 F (36.8 C) (05/27 0941) Pulse Rate:  [53-93] 93 (05/27 0941) Resp:  [17-20] 18 (05/27 0941) BP: (104-144)/(48-83) 144/83 (05/27 0941) SpO2:  [100 %] 100 % (05/27 0941) Intake/Output      05/26 0701 - 05/27 0700 05/27 0701 - 05/28 0700   P.O. 600    Total Intake(mL/kg) 600 (6.6)    Urine (mL/kg/hr) 1000 (0.5)    Total Output 1000     Net -400          Urine Occurrence 1 x     Awake and alert Follows commands throughout Full strength Incision flat, c/d/i  Plan Upon entering room, patient is joking around with nursing and smiling. When I start to talk to him, he starts to act as if he is in excruciating pain. Will continue current care for now of IV steroids. Will consider d/c home later today with medrol dose pack after discussing with Dr Conchita ParisNundkumar.  Ambien prn q hs for sleep issues.

## 2017-05-06 NOTE — Progress Notes (Addendum)
Continues to have positional headache, no significant change.  He is awake, alert, interactive and appropriate.  I will defer to neurosurgery regarding need for further workup to investigate his positional headache. This first treatment, I think that first line would be seeing how he does with the steroids and physical my. He continues to have headaches, could start amitriptyline 25 mg daily at bedtime if it is not felt to be related to CSF dynamics.  At this time, no further recommendations. Please call if further questions or concerns.  Ritta SlotMcNeill Kirkpatrick, MD [facility] [phone]  If 7pm- 7am, please page neurology on call as listed in AMION.

## 2017-05-07 MED ORDER — KETOROLAC TROMETHAMINE 30 MG/ML IJ SOLN
30.0000 mg | INTRAMUSCULAR | Status: DC
  Administered 2017-05-07: 30 mg via INTRAVENOUS
  Filled 2017-05-07: qty 1

## 2017-05-07 NOTE — Progress Notes (Signed)
Patient is in room, independent, requested and received pain medicine as ordered. PT walked the halls several times with family and friends. Will continue to monitor.

## 2017-05-07 NOTE — Progress Notes (Signed)
PT Cancellation Note  Patient Details Name: Sergio BeaversMiguel Boyd MRN: [id number] DOB: 05-Apr-1971   Cancelled Treatment:    Reason Eval/Treat Not Completed: Patient at procedure or test/unavailable.  Pt out and about in the halls of the hospital independent and unable to be found.  Will try back later as able. 05/07/2017  [place] BingKen [name], PT [phone]  (pager)   [name] [name] 05/07/2017, 5:25 PM

## 2017-05-07 NOTE — Progress Notes (Signed)
Pt seen and examined.  No issues overnight. HA improved this morning No new symptoms  EXAM: Temp:  [97.8 F (36.6 C)-98.2 F (36.8 C)] 97.8 F (36.6 C) (05/28 0552) Pulse Rate:  [52-93] 60 (05/28 0552) Resp:  [18] 18 (05/28 0552) BP: (101-144)/(43-83) 101/50 (05/28 0552) SpO2:  [100 %] 100 % (05/28 0552) Intake/Output      05/27 0701 - 05/28 0700 05/28 0701 - 05/29 0700   P.O. 600    Total Intake(mL/kg) 600 (6.6)    Urine (mL/kg/hr) 800 (0.4)    Total Output 800     Net -200          Urine Occurrence 2 x     Awake and alert Follows commands throughout Full strength  Stable Continue current care Last day of IV steroids tomorrow

## 2017-05-08 ENCOUNTER — Encounter (HOSPITAL_COMMUNITY): Payer: Self-pay | Admitting: Neurological Surgery

## 2017-05-08 MED ORDER — CARISOPRODOL 350 MG PO TABS: 350.0000 mg | ORAL_TABLET | Freq: Four times a day (QID) | ORAL | 0 refills | Status: DC | PRN

## 2017-05-08 MED ORDER — OXYCODONE-ACETAMINOPHEN 5-325 MG PO TABS: 1.0000 | ORAL_TABLET | ORAL | 0 refills | Status: DC | PRN

## 2017-05-08 MED ORDER — METHYLPREDNISOLONE 4 MG PO TBPK: ORAL_TABLET | ORAL | 0 refills | Status: DC

## 2017-05-08 MED ORDER — KETOROLAC TROMETHAMINE 10 MG PO TABS: 10.0000 mg | ORAL_TABLET | Freq: Four times a day (QID) | ORAL | 0 refills | Status: AC | PRN

## 2017-05-08 MED ORDER — GABAPENTIN 300 MG PO CAPS: 300.0000 mg | ORAL_CAPSULE | Freq: Three times a day (TID) | ORAL | 2 refills | Status: DC

## 2017-05-08 NOTE — Discharge Summary (Signed)
Physician Discharge Summary  Patient ID: Sergio Boyd MRN: [id number] DOB/AGE: Sep 05, 1971 46 y.o.  Admit date: 04/20/2017 Discharge date: 05/08/2017  Admission Diagnoses:  Postprocedural pseudomeningocele  Discharge Diagnoses:  Same Active Problems:   Postprocedural pseudomeningocele  Discharged Condition: Stable  Hospital Course:  Sergio Boyd is a 46 y.o. male who was admitted for the below procedure. There were no post operative complications. He continued to complain of headache that he was having prior to surgery. MRI Cspine and Brain were ordered and without cause of symptoms. Incision is flat, no sign of CSF leak. Neuro consulted. Appreciative of recs. At this point, no cause for HA identified. Reassurance it will get better with time. He also was complaining of b/l calf pain. Vascular US venous negative for DVT. Reassurance provided. Also had issues with constipation. Resolved with Magnesium citrate. At time of discharge, he was ambulating with PT/OT,  tolerating po, voiding normal. Ready for discharge.  Treatments: Surgery - Repair of Pseudomeningocele and Placement of Lumbar Drain (N/A); PLACEMENT OF LUMBAR DRAIN (N/A)  Discharge Exam: Blood pressure 130/72, pulse (!) 55, temperature 98.2 F (36.8 C), temperature source Oral, resp. rate 17, height 5\' 10"  (1.778 m), weight 90.7 kg (200 lb), SpO2 100 %. Awake, alert, oriented Speech fluent, appropriate CN grossly intact 5/5 BUE/BLE Wound c/d/i  Disposition: 01-Home or Self Care  Discharge Instructions    Call MD for:  difficulty breathing, headache or visual disturbances    Complete by:  As directed    Call MD for:  persistant dizziness or light-headedness    Complete by:  As directed    Call MD for:  redness, tenderness, or signs of infection (pain, swelling, redness, odor or green/yellow discharge around incision site)    Complete by:  As directed    Call MD for:  severe uncontrolled pain    Complete by:  As directed     Call MD for:  temperature >100.4    Complete by:  As directed    DME Bedside commode    Complete by:  As directed    Patient needs a bedside commode to treat with the following condition:  Pain   Diet general    Complete by:  As directed    Driving Restrictions    Complete by:  As directed    Do not drive until given clearance.   Increase activity slowly    Complete by:  As directed    Lifting restrictions    Complete by:  As directed    Do not lift anything >10lbs. Avoid bending and twisting in awkward positions. Avoid bending at the back.   May shower / Bathe    Complete by:  As directed    In 24 hours. Okay to wash wound with warm soapy water. Avoid scrubbing the wound. Pat dry.   Remove dressing in 24 hours    Complete by:  As directed      Allergies as of 05/08/2017   No Active Allergies     Medication List    STOP taking these medications   acetaZOLAMIDE 250 MG tablet Commonly known as:  DIAMOX     TAKE these medications   ALPRAZolam 0.5 MG tablet Commonly known as:  XANAX Take 0.5 mg by mouth once. Prior to MRI   carisoprodol 350 MG tablet Commonly known as:  SOMA Take 1 tablet (350 mg total) by mouth 4 (four) times daily as needed (neck spasm).   dibucaine 1 % Oint Commonly known as:  NUPERCAINAL Place 1 application rectally 3 (three) times daily as needed for pain.   docusate sodium 100 MG capsule Commonly known as:  COLACE Take 1 capsule (100 mg total) by mouth 2 (two) times daily.   gabapentin 300 MG capsule Commonly known as:  NEURONTIN Take 1 capsule (300 mg total) by mouth 3 (three) times daily.   hydrocortisone 2.5 % rectal cream Commonly known as:  ANUSOL-HC Place 1 application rectally 2 (two) times daily.   hydrocortisone 25 MG suppository Commonly known as:  ANUSOL-HC Place 1 suppository (25 mg total) rectally 2 (two) times daily.   ketorolac 10 MG tablet Commonly known as:  TORADOL Take 1 tablet (10 mg total) by mouth every 6 (six)  hours as needed.   methylPREDNISolone 4 MG Tbpk tablet Commonly known as:  MEDROL DOSEPAK Take by mouth as directed. What changed:  how to take this   MOVANTIK 25 MG Tabs tablet Generic drug:  naloxegol oxalate Take 25 mg by mouth daily.   oxyCODONE 5 MG immediate release tablet Commonly known as:  Oxy IR/ROXICODONE Take 1-2 tablets (5-10 mg total) by mouth every 6 (six) hours as needed for moderate pain, severe pain or breakthrough pain.   oxyCODONE-acetaminophen 5-325 MG tablet Commonly known as:  PERCOCET/ROXICET Take 1 tablet by mouth every 4 (four) hours as needed. What changed:  when to take this  reasons to take this   psyllium 28 % packet Commonly known as:  METAMUCIL SMOOTH TEXTURE Take 1 packet by mouth 2 (two) times daily.            Durable Medical Equipment        Start     Ordered   05/08/17 0000  DME Bedside commode    Question:  Patient needs a bedside commode to treat with the following condition  Answer:  Pain   05/08/17 1049     Follow-up Information    Ditty, Loura Halt, MD. Schedule an appointment as soon as possible for a visit in 3 week(s).   Specialty:  Neurosurgery Contact information: [address] [place] [address] [phone]           Signed: Alyson Ingles 05/08/2017, 10:49 AM

## 2017-05-08 NOTE — Progress Notes (Signed)
Physical Therapy Treatment Patient Details Name: Sergio Boyd MRN: [id number] DOB: [birth year] Today's Date: 05/08/2017    History of Present Illness Patient is a 46 yo male s/p re-operative laminectomy right L5-S1, left L5-S1 laminectomy for decompression; repair of pseudomeningocele. S/p drain placement.     PT Comments    Pt has met all goals however pt con't to report 10/10 pain and intense cervical pain and headache that's unbearable. Pt ambulating in hallways and able to complete full flight of stairs without difficulty. Acute PT will con't to monitor patient to ensure pt maintains independence with mobility.   Follow Up Recommendations  No PT follow up     Equipment Recommendations  None recommended by PT    Recommendations for Other Services       Precautions / Restrictions Precautions Precautions: Fall;Back Precaution Booklet Issued: Yes (comment) Precaution Comments: pt able to recall 3/3 precautions Restrictions Weight Bearing Restrictions: No    Mobility  Bed Mobility               General bed mobility comments: pt sitting EOB upon PT arrival  Transfers Overall transfer level: Modified independent Equipment used: None Transfers: Sit to/from Stand Sit to Stand: Modified independent (Device/Increase time)         General transfer comment: pt independent, not difficulty  Ambulation/Gait Ambulation/Gait assistance: Modified independent (Device/Increase time) Ambulation Distance (Feet): 500 Feet Assistive device: None Gait Pattern/deviations: WFL(Within Functional Limits) Gait velocity: decreased Gait velocity interpretation: Below normal speed for age/gender General Gait Details: pt amb in hallway indep without assist of staff or family, no episodes of LOB however guarded and cautious   Stairs Stairs: Yes   Stair Management: One rail Left Number of Stairs: 12 (x2 trials) General stair comments: pt required increased time but no physical assist  requirec  Wheelchair Mobility    Modified Rankin (Stroke Patients Only)       Balance Overall balance assessment: No apparent balance deficits (not formally assessed)                                          Cognition Arousal/Alertness: Awake/alert Behavior During Therapy: Flat affect Overall Cognitive Status: Within Functional Limits for tasks assessed                                        Exercises      General Comments General comments (skin integrity, edema, etc.): completed therapeutic massage and gentle cervical stretching x 5 min      Pertinent Vitals/Pain Pain Score: 10-Worst pain ever Pain Location: cervical spine and headache Pain Descriptors / Indicators: Sharp;Penetrating Pain Intervention(s): Monitored during session (recommend positional changes for optimal relief, limited ROM)    Home Living                      Prior Function            PT Goals (current goals can now be found in the care plan section) Acute Rehab PT Goals Patient Stated Goal: decrease neck pain Progress towards PT goals: Progressing toward goals    Frequency    Min 2X/week      PT Plan Frequency needs to be updated    Co-evaluation  AM-PAC PT "6 Clicks" Daily Activity  Outcome Measure  Difficulty turning over in bed (including adjusting bedclothes, sheets and blankets)?: None Difficulty moving from lying on back to sitting on the side of the bed? : None Difficulty sitting down on and standing up from a chair with arms (e.g., wheelchair, bedside commode, etc,.)?: None Help needed moving to and from a bed to chair (including a wheelchair)?: None Help needed walking in hospital room?: None Help needed climbing 3-5 steps with a railing? : None 6 Click Score: 24    End of Session Equipment Utilized During Treatment: Gait belt Activity Tolerance: Patient limited by pain Patient left: with family/visitor  present (sitting EOB) Nurse Communication: Mobility status PT Visit Diagnosis: Pain Pain - part of body:  (back)     Time: 8786-7672 PT Time Calculation (min) (ACUTE ONLY): 29 min  Charges:  $Gait Training: 8-22 mins $Therapeutic Activity: 8-22 mins                    G Codes:       Kittie Plater, PT, DPT Pager #: [phone] Office #: [phone]    [place] 05/08/2017, 11:41 AM

## 2017-05-08 NOTE — Progress Notes (Addendum)
Pt appears frustrated his medication for headache , neck pain is not working.Pt wants to know what causes all of this pain particularly the neck pain.  Pt stating constant sharp excruciating neck pain that kept him awake even with position changes pain persists.  Pt may wants to restart oxycodone and also magnesium citrate. Pt states oxycodone helps with the pain however higher dosage to relieve headache. Pt Didn't like the side effects of constipation d/t narcotic but advise on magnesium citrate which had worked for the pt if the pt wants to relieve pain which appears to be the focus of the frustration .  Pt appears to be agreeable at the end. Hot pack was placed on neck, Soma and Fioricet was administered.

## 2017-05-08 NOTE — Progress Notes (Signed)
D/c instruction reviewed with patient and mother

## 2017-05-08 NOTE — Progress Notes (Signed)
Patient's mother returned to the hospital to say that, she forgot patient's bedside commode by valet parking. She states "I thought they put put it in the trunk" when asked who? she said "valet"  Patient's mother states "I want another bedside commode and my son is not paying for the lost one"  Case manager notified to follow up with patient and family

## 2017-05-08 NOTE — Progress Notes (Signed)
Patient in bed sleeping this morning. Rounded twice to the room-patient is observed sleeping. No signs/symptoms of pain or discomfort observed.Will continue to monitor

## 2017-05-08 NOTE — Care Management Note (Signed)
Case Management Note  Patient Details  Name: Sergio Boyd Clerk MRN: [id number] Date of Birth: 03-Mar-1971  Subjective/Objective:                    Action/Plan: Pt discharging home with self care. No f/u per PT/OT. Pt with orders for 3 in 1. [facility] and will deliver the equipment to the room.  Patients mother to provide transport home.   Expected Discharge Date:  05/03/17               Expected Discharge Plan:  Home/Self Care  In-House Referral:     Discharge planning Services  CM Consult  Post Acute Care Choice:  Durable Medical Equipment Choice offered to:  Patient  DME Arranged:  3-N-1 DME Agency:  [facility]  [facility]:     Status of Service:  Completed, signed off  If discussed at Long Length of Stay Meetings, dates discussed:    Additional Comments:  Kermit BaloKelli F Noha Karasik, RN 05/08/2017, 11:01 AM

## 2017-05-11 NOTE — Op Note (Signed)
04/20/2017 - 04/24/2017  1:34 PM  PATIENT:  Sergio Boyd  46 y.o. male  PRE-OPERATIVE DIAGNOSIS:  Persistent lumbar pseudomeningocele  POST-OPERATIVE DIAGNOSIS:  Same  PROCEDURE:  Lumbar wound exploration, repair of cerebrospinal fluid fistula, placement of lumbar drain; use of operating microscope  SURGEON:  Hulan SaasBenjamin J. Ditty, MD  ASSISTANTS: Lisbeth RenshawNeelesh Nundkumar, MD  ANESTHESIA:   General  DRAINS: Lumbar drain   SPECIMEN:  None  INDICATION FOR PROCEDURE: 46-year-old male with persistent pseudomeningocele despite recent repair of lumbar cerebrospinal fluid fistula. I recommended the above operation. Patient understood the risks, benefits, and alternatives and potential outcomes and wished to proceed.  PROCEDURE DETAILS: After smooth induction of general endotracheal anesthesia the patient was turned prone on the operating table. The lumbar area was prepped and draped in the usual sterile fashion.  A timeout was performed. The existing incision was reopened. I encountered spinal fluid under pressure. Retractors were placed into the depths of the wound to expose the thecal sac. We brought the microscope into the field to provide light and magnification. We inspected the 2 dorsal repairs and they were found to be intact. The patch repair over the right S1 nerve root was found to be leaking. We irrigated vigorously. We further explored and determined that there were no other sources of leak. A piece of DuraGen was soaked in blood and placed over the nerve root durotomy. This was then sealed with Adherus.  A Tuohy needle was used access the subarachnoid space percutaneously at approximately L3-4.  A lumbar drain catheter with a stylette was then passed into the subarachnoid space. The stylet was removed and there was spontaneous flow of spinal fluid. This was secured at the skin and in a tension loop. The lumbar wound was reexamined and found not to be leaking spinal fluid. It was closed in routine  anatomic layers with interrupted Vicryl sutures. The skin was closed with Dermabond.  PATIENT DISPOSITION:  PACU - hemodynamically stable.   Delay start of Pharmacological VTE agent (>24hrs) due to surgical blood loss or risk of bleeding:  yes

## 2017-05-21 DIAGNOSIS — M545 Low back pain: Secondary | ICD-10-CM | POA: Diagnosis not present

## 2017-05-21 DIAGNOSIS — R269 Unspecified abnormalities of gait and mobility: Secondary | ICD-10-CM | POA: Diagnosis not present

## 2017-05-21 DIAGNOSIS — M6281 Muscle weakness (generalized): Secondary | ICD-10-CM | POA: Diagnosis not present

## 2017-05-21 DIAGNOSIS — R293 Abnormal posture: Secondary | ICD-10-CM | POA: Diagnosis not present

## 2017-05-24 DIAGNOSIS — M545 Low back pain: Secondary | ICD-10-CM | POA: Diagnosis not present

## 2017-05-24 DIAGNOSIS — R293 Abnormal posture: Secondary | ICD-10-CM | POA: Diagnosis not present

## 2017-05-24 DIAGNOSIS — M6281 Muscle weakness (generalized): Secondary | ICD-10-CM | POA: Diagnosis not present

## 2017-05-24 DIAGNOSIS — R269 Unspecified abnormalities of gait and mobility: Secondary | ICD-10-CM | POA: Diagnosis not present

## 2017-05-29 DIAGNOSIS — M545 Low back pain: Secondary | ICD-10-CM | POA: Diagnosis not present

## 2017-05-29 DIAGNOSIS — M6281 Muscle weakness (generalized): Secondary | ICD-10-CM | POA: Diagnosis not present

## 2017-05-29 DIAGNOSIS — R269 Unspecified abnormalities of gait and mobility: Secondary | ICD-10-CM | POA: Diagnosis not present

## 2017-05-29 DIAGNOSIS — R293 Abnormal posture: Secondary | ICD-10-CM | POA: Diagnosis not present

## 2017-05-31 DIAGNOSIS — M545 Low back pain: Secondary | ICD-10-CM | POA: Diagnosis not present

## 2017-05-31 DIAGNOSIS — R269 Unspecified abnormalities of gait and mobility: Secondary | ICD-10-CM | POA: Diagnosis not present

## 2017-05-31 DIAGNOSIS — R293 Abnormal posture: Secondary | ICD-10-CM | POA: Diagnosis not present

## 2017-05-31 DIAGNOSIS — M6281 Muscle weakness (generalized): Secondary | ICD-10-CM | POA: Diagnosis not present

## 2017-06-04 DIAGNOSIS — R269 Unspecified abnormalities of gait and mobility: Secondary | ICD-10-CM | POA: Diagnosis not present

## 2017-06-04 DIAGNOSIS — M545 Low back pain: Secondary | ICD-10-CM | POA: Diagnosis not present

## 2017-06-04 DIAGNOSIS — M6281 Muscle weakness (generalized): Secondary | ICD-10-CM | POA: Diagnosis not present

## 2017-06-04 DIAGNOSIS — R293 Abnormal posture: Secondary | ICD-10-CM | POA: Diagnosis not present

## 2017-06-06 DIAGNOSIS — M545 Low back pain: Secondary | ICD-10-CM | POA: Diagnosis not present

## 2017-06-06 DIAGNOSIS — R293 Abnormal posture: Secondary | ICD-10-CM | POA: Diagnosis not present

## 2017-06-06 DIAGNOSIS — R269 Unspecified abnormalities of gait and mobility: Secondary | ICD-10-CM | POA: Diagnosis not present

## 2017-06-06 DIAGNOSIS — M6281 Muscle weakness (generalized): Secondary | ICD-10-CM | POA: Diagnosis not present

## 2017-06-11 DIAGNOSIS — M545 Low back pain: Secondary | ICD-10-CM | POA: Diagnosis not present

## 2017-06-11 DIAGNOSIS — R293 Abnormal posture: Secondary | ICD-10-CM | POA: Diagnosis not present

## 2017-06-11 DIAGNOSIS — M6281 Muscle weakness (generalized): Secondary | ICD-10-CM | POA: Diagnosis not present

## 2017-06-11 DIAGNOSIS — R269 Unspecified abnormalities of gait and mobility: Secondary | ICD-10-CM | POA: Diagnosis not present

## 2017-06-14 DIAGNOSIS — R269 Unspecified abnormalities of gait and mobility: Secondary | ICD-10-CM | POA: Diagnosis not present

## 2017-06-14 DIAGNOSIS — M6281 Muscle weakness (generalized): Secondary | ICD-10-CM | POA: Diagnosis not present

## 2017-06-14 DIAGNOSIS — R293 Abnormal posture: Secondary | ICD-10-CM | POA: Diagnosis not present

## 2017-06-14 DIAGNOSIS — M545 Low back pain: Secondary | ICD-10-CM | POA: Diagnosis not present

## 2017-06-19 DIAGNOSIS — R293 Abnormal posture: Secondary | ICD-10-CM | POA: Diagnosis not present

## 2017-06-19 DIAGNOSIS — M545 Low back pain: Secondary | ICD-10-CM | POA: Diagnosis not present

## 2017-06-19 DIAGNOSIS — M6281 Muscle weakness (generalized): Secondary | ICD-10-CM | POA: Diagnosis not present

## 2017-06-19 DIAGNOSIS — R269 Unspecified abnormalities of gait and mobility: Secondary | ICD-10-CM | POA: Diagnosis not present

## 2017-06-21 DIAGNOSIS — R269 Unspecified abnormalities of gait and mobility: Secondary | ICD-10-CM | POA: Diagnosis not present

## 2017-06-21 DIAGNOSIS — M6281 Muscle weakness (generalized): Secondary | ICD-10-CM | POA: Diagnosis not present

## 2017-06-21 DIAGNOSIS — R293 Abnormal posture: Secondary | ICD-10-CM | POA: Diagnosis not present

## 2017-06-21 DIAGNOSIS — M545 Low back pain: Secondary | ICD-10-CM | POA: Diagnosis not present

## 2017-08-01 DIAGNOSIS — Z6829 Body mass index (BMI) 29.0-29.9, adult: Secondary | ICD-10-CM | POA: Diagnosis not present

## 2017-08-01 DIAGNOSIS — M5127 Other intervertebral disc displacement, lumbosacral region: Secondary | ICD-10-CM | POA: Diagnosis not present

## 2017-08-29 DIAGNOSIS — M5416 Radiculopathy, lumbar region: Secondary | ICD-10-CM | POA: Diagnosis not present

## 2017-09-13 DIAGNOSIS — M48061 Spinal stenosis, lumbar region without neurogenic claudication: Secondary | ICD-10-CM | POA: Diagnosis not present

## 2017-09-13 DIAGNOSIS — M5416 Radiculopathy, lumbar region: Secondary | ICD-10-CM | POA: Diagnosis not present

## 2017-09-19 DIAGNOSIS — M542 Cervicalgia: Secondary | ICD-10-CM | POA: Diagnosis not present

## 2017-09-19 DIAGNOSIS — M5127 Other intervertebral disc displacement, lumbosacral region: Secondary | ICD-10-CM | POA: Diagnosis not present

## 2017-09-19 DIAGNOSIS — M5416 Radiculopathy, lumbar region: Secondary | ICD-10-CM | POA: Diagnosis not present

## 2017-10-04 DIAGNOSIS — M5416 Radiculopathy, lumbar region: Secondary | ICD-10-CM | POA: Diagnosis not present

## 2017-10-04 DIAGNOSIS — M5127 Other intervertebral disc displacement, lumbosacral region: Secondary | ICD-10-CM | POA: Diagnosis not present

## 2017-10-05 DIAGNOSIS — M5127 Other intervertebral disc displacement, lumbosacral region: Secondary | ICD-10-CM | POA: Diagnosis not present

## 2017-10-05 DIAGNOSIS — M5416 Radiculopathy, lumbar region: Secondary | ICD-10-CM | POA: Diagnosis not present

## 2017-10-05 DIAGNOSIS — Z6830 Body mass index (BMI) 30.0-30.9, adult: Secondary | ICD-10-CM | POA: Diagnosis not present

## 2017-10-05 DIAGNOSIS — R030 Elevated blood-pressure reading, without diagnosis of hypertension: Secondary | ICD-10-CM | POA: Diagnosis not present

## 2017-10-25 DIAGNOSIS — M5127 Other intervertebral disc displacement, lumbosacral region: Secondary | ICD-10-CM | POA: Diagnosis not present

## 2017-11-07 DIAGNOSIS — R030 Elevated blood-pressure reading, without diagnosis of hypertension: Secondary | ICD-10-CM | POA: Diagnosis not present

## 2017-11-07 DIAGNOSIS — M5416 Radiculopathy, lumbar region: Secondary | ICD-10-CM | POA: Diagnosis not present

## 2017-11-07 DIAGNOSIS — M5127 Other intervertebral disc displacement, lumbosacral region: Secondary | ICD-10-CM | POA: Diagnosis not present

## 2017-11-07 DIAGNOSIS — Z6830 Body mass index (BMI) 30.0-30.9, adult: Secondary | ICD-10-CM | POA: Diagnosis not present

## 2017-12-21 DIAGNOSIS — I10 Essential (primary) hypertension: Secondary | ICD-10-CM | POA: Diagnosis not present

## 2017-12-21 DIAGNOSIS — M5127 Other intervertebral disc displacement, lumbosacral region: Secondary | ICD-10-CM | POA: Diagnosis not present

## 2017-12-21 DIAGNOSIS — Z6830 Body mass index (BMI) 30.0-30.9, adult: Secondary | ICD-10-CM | POA: Diagnosis not present

## 2018-01-24 ENCOUNTER — Encounter (HOSPITAL_COMMUNITY): Payer: Self-pay | Admitting: Emergency Medicine

## 2018-01-24 ENCOUNTER — Other Ambulatory Visit: Payer: Self-pay

## 2018-01-24 DIAGNOSIS — Y990 Civilian activity done for income or pay: Secondary | ICD-10-CM | POA: Insufficient documentation

## 2018-01-24 DIAGNOSIS — Y9289 Other specified places as the place of occurrence of the external cause: Secondary | ICD-10-CM | POA: Insufficient documentation

## 2018-01-24 DIAGNOSIS — F17210 Nicotine dependence, cigarettes, uncomplicated: Secondary | ICD-10-CM | POA: Diagnosis not present

## 2018-01-24 DIAGNOSIS — S63501A Unspecified sprain of right wrist, initial encounter: Secondary | ICD-10-CM | POA: Diagnosis not present

## 2018-01-24 DIAGNOSIS — Z79899 Other long term (current) drug therapy: Secondary | ICD-10-CM | POA: Diagnosis not present

## 2018-01-24 DIAGNOSIS — S6991XA Unspecified injury of right wrist, hand and finger(s), initial encounter: Secondary | ICD-10-CM | POA: Diagnosis not present

## 2018-01-24 DIAGNOSIS — Y9389 Activity, other specified: Secondary | ICD-10-CM | POA: Diagnosis not present

## 2018-01-24 DIAGNOSIS — X58XXXA Exposure to other specified factors, initial encounter: Secondary | ICD-10-CM | POA: Diagnosis not present

## 2018-01-24 DIAGNOSIS — M25531 Pain in right wrist: Secondary | ICD-10-CM | POA: Diagnosis not present

## 2018-01-24 NOTE — ED Triage Notes (Signed)
Pt states he was using a drill today and got a sharp pain in his right wrist and it went down into his hand  Pt states the pain has gotten worse throughout the day  Pt states it is hard to move his fingers  Pt has strong radial pulse noted

## 2018-01-25 ENCOUNTER — Emergency Department (HOSPITAL_COMMUNITY): Payer: BLUE CROSS/BLUE SHIELD

## 2018-01-25 ENCOUNTER — Encounter: Payer: Self-pay | Admitting: Medical

## 2018-01-25 ENCOUNTER — Telehealth: Payer: Self-pay | Admitting: Medical

## 2018-01-25 ENCOUNTER — Emergency Department (HOSPITAL_COMMUNITY)
Admission: EM | Admit: 2018-01-25 | Discharge: 2018-01-25 | Disposition: A | Discharge status: Home / Self Care | Payer: BLUE CROSS/BLUE SHIELD | Attending: Emergency Medicine | Admitting: Emergency Medicine

## 2018-01-25 DIAGNOSIS — M25531 Pain in right wrist: Secondary | ICD-10-CM | POA: Diagnosis not present

## 2018-01-25 DIAGNOSIS — S63501A Unspecified sprain of right wrist, initial encounter: Secondary | ICD-10-CM

## 2018-01-25 MED ORDER — OXYCODONE-ACETAMINOPHEN 5-325 MG PO TABS: 1.0000 | ORAL_TABLET | ORAL | 0 refills | Status: DC | PRN

## 2018-01-25 MED ORDER — IBUPROFEN 600 MG PO TABS: 600.0000 mg | ORAL_TABLET | Freq: Four times a day (QID) | ORAL | 0 refills | Status: DC | PRN

## 2018-01-25 MED ORDER — HYDROCODONE-ACETAMINOPHEN 5-325 MG PO TABS
1.0000 | ORAL_TABLET | Freq: Once | ORAL | Status: AC
  Administered 2018-01-25: 1 via ORAL
  Filled 2018-01-25: qty 1

## 2018-01-25 NOTE — Discharge Instructions (Signed)
Follow up with Dr. Eulah PontMurphy if wrist pain is no better in 3-4 days.

## 2018-01-25 NOTE — ED Notes (Addendum)
Pt reports pain after using power tool

## 2018-01-25 NOTE — ED Provider Notes (Signed)
[facility]-EMERGENCY DEPT Provider Note   CSN: [id number] Arrival date & time: 01/24/18  2116     History   Chief Complaint Chief Complaint  Patient presents with  . Wrist Pain    HPI Sergio Boyd is a 47 y.o. male.  Right hand dominant patient here for evaluation of right wrist pain that started yesterday morning at around 11:00 while he was using a drill at work. He felt a sudden, sharp, severe pain in the ulnar wrist that progressed through the day, with radiating pain to forearm. No other injury. No numbness.   The history is provided by the patient. No language interpreter was used.  Wrist Pain     Past Medical History:  Diagnosis Date  . Arthritis   . Chronic back pain    herniated disc  . GERD (gastroesophageal reflux disease)   . Headache   . History of migraine   . Joint pain     Patient Active Problem List   Diagnosis Date Noted  . 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 External hemorrhoid 04/17/2017  . Internal hemorrhoids 04/17/2017  . Chronic neck pain 04/17/2017  . Headache syndrome 04/17/2017  . Chronic low back pain 04/17/2017  . Localized swelling of back 04/17/2017  . H/O lumbosacral spine surgery 04/17/2017    Past Surgical History:  Procedure Laterality Date  . INGUINAL HERNIA REPAIR Right   . INGUINAL HERNIA REPAIR Right 11/23/2015   Procedure: RIGHT INGUINAL HERNIA REPAIR W/MESH;  Surgeon: Avel Peaceodd Rosenbower, MD;  [facility];  Service: General;  Laterality: Right;  . INSERTION OF MESH Right 11/23/2015   Procedure: INSERTION OF MESH;  Surgeon: Avel Peaceodd Rosenbower, MD;  [facility];  Service: General;  Laterality: Right;  . KNEE SURGERY     right ACL 2012  . LUMBAR LAMINECTOMY/DECOMPRESSION MICRODISCECTOMY N/A 04/20/2017   Procedure: Re Operative Right Lumbar five-Sacral one Laminectomy/Recurrent discectomy, Left Lumbar four-five Laminectomy for decompression, pseudomeningocele repair;  Surgeon: Ditty,  Loura HaltBenjamin Jared, MD;  [facility];  Service: Neurosurgery;  Laterality: N/A;  . LUMBAR SPINE SURGERY  02/2017   Dr. Bevely Palmeritty  . LUMBAR WOUND DEBRIDEMENT N/A 04/24/2017   Procedure: Repair of Pseudomeningocele and Placement of Lumbar Drain;  Surgeon: Ditty, Loura HaltBenjamin Jared, MD;  [facility];  Service: Neurosurgery;  Laterality: N/A;  . PLACEMENT OF LUMBAR DRAIN N/A 04/24/2017   Procedure: PLACEMENT OF LUMBAR DRAIN;  Surgeon: Ditty, Loura HaltBenjamin Jared, MD;  Location: [facility] OR;  Service: Neurosurgery;  Laterality: N/A;       Home Medications    Prior to Admission medications   Medication Sig Start Date End Date Taking? Authorizing Provider  ALPRAZolam Prudy Feeler(XANAX) 0.5 MG tablet Take 0.5 mg by mouth once. Prior to MRI 04/18/17   [provider]  carisoprodol (SOMA) 350 MG tablet Take 1 tablet (350 mg total) by mouth 4 (four) times daily as needed (neck spasm). 05/08/17   Costella, Darci CurrentVincent J, PA-C  dibucaine (NUPERCAINAL) 1 % OINT Place 1 application rectally 3 (three) times daily as needed for pain. 04/14/17   Barrett HenleNadeau, Nicole Elizabeth, PA-C  docusate sodium (COLACE) 100 MG capsule Take 1 capsule (100 mg total) by mouth 2 (two) times daily. 04/17/17   Tysinger, Kermit Baloavid S, PA-C  gabapentin (NEURONTIN) 300 MG capsule Take 1 capsule (300 mg total) by mouth 3 (three) times daily. 05/08/17   Costella, Darci CurrentVincent J, PA-C  hydrocortisone (ANUSOL-HC) 2.5 % rectal cream Place 1 application rectally 2 (two) times daily. 04/17/17   Crosby Oysterysinger, David  S, PA-C  hydrocortisone (ANUSOL-HC) 25 MG suppository Place 1 suppository (25 mg total) rectally 2 (two) times daily. 04/17/17   Tysinger, Kermit Balo, PA-C  methylPREDNISolone (MEDROL DOSEPAK) 4 MG TBPK tablet Take by mouth as directed. 05/08/17   Costella, Vincent J, PA-C  MOVANTIK 25 MG TABS tablet Take 25 mg by mouth daily. 04/14/17   [provider]  oxyCODONE (OXY IR/ROXICODONE) 5 MG immediate release tablet Take 1-2 tablets (5-10 mg total) by mouth every 6 (six) hours as  needed for moderate pain, severe pain or breakthrough pain. 02/27/17   Tysinger, Kermit Balo, PA-C  oxyCODONE-acetaminophen (PERCOCET/ROXICET) 5-325 MG tablet Take 1 tablet by mouth every 4 (four) hours as needed. 05/08/17   Costella, Darci Current, PA-C  psyllium (METAMUCIL SMOOTH TEXTURE) 28 % packet Take 1 packet by mouth 2 (two) times daily. 04/14/17   Barrett Henle, PA-C    Family History History reviewed. No pertinent family history.  Social History Social History   Tobacco Use  . Smoking status: Current Every Day Smoker    Packs/day: 0.10    Years: 10.00    Pack years: 1.00  . Smokeless tobacco: Never Used  Substance Use Topics  . Alcohol use: No  . Drug use: No     Allergies   Patient has no active allergies.   Review of Systems Review of Systems  Musculoskeletal:       See HPI.  Skin: Negative for color change and wound.  Neurological: Negative for numbness.     Physical Exam Updated Vital Signs BP 123/81 (BP Location: Right Arm)   Pulse (!) 56   Temp 98.2 F (36.8 C) (Oral)   Resp 16   SpO2 98%   Physical Exam  Constitutional: He is oriented to person, place, and time. He appears well-developed and well-nourished.  Neck: Normal range of motion.  Cardiovascular: Intact distal pulses.  Pulmonary/Chest: Effort normal.  Musculoskeletal: Normal range of motion.  Minimal swelling to right wrist with significant dorsal tenderness to midline and ulnar wrist. FROM all digits. Limited flexion and extension of left wrist due to pain.   Neurological: He is alert and oriented to person, place, and time.  Skin: Skin is warm and dry. Capillary refill takes less than 2 seconds.  Psychiatric: He has a normal mood and affect.     ED Treatments / Results  Labs (all labs ordered are listed, but only abnormal results are displayed) Labs Reviewed - No data to display  EKG  EKG Interpretation None       Radiology No results found.  Procedures Procedures  (including critical care time)  Medications Ordered in ED Medications - No data to display   Initial Impression / Assessment and Plan / ED Course  I have reviewed the triage vital signs and the nursing notes.  Pertinent labs & imaging results that were available during my care of the patient were reviewed by me and considered in my medical decision making (see chart for details).     Patient presents with right wrist pain after drilling yesterday. Pain worsened throughout the day. No direct trauma.  Imaging is negative. Exam is consistent with joint sprain requiring supportive care.   Final Clinical Impressions(s) / ED Diagnoses   Final diagnoses:  None   1. Right wrist sprain  ED Discharge Orders    None       Elpidio Anis, PA-C 01/25/18 1433    Tegeler, Canary Brim, MD 01/27/18 1344

## 2018-01-25 NOTE — Telephone Encounter (Signed)
Please get in touch with the patient about recent visit to the emergency department.  Remind them to use us first for non emergency issues.  The emergency dept is expensive, should be used primarily for emergencies, and they should be encouraged to come here for non emergency problems.  This saves every body money, saves them time, and is better for continuity of care.  If they are ever unsure, they can always call here first, unless after hours or weekends.   

## 2018-02-06 DIAGNOSIS — S63501D Unspecified sprain of right wrist, subsequent encounter: Secondary | ICD-10-CM | POA: Diagnosis not present

## 2018-02-18 DIAGNOSIS — M25531 Pain in right wrist: Secondary | ICD-10-CM | POA: Diagnosis not present

## 2018-02-27 DIAGNOSIS — S63501A Unspecified sprain of right wrist, initial encounter: Secondary | ICD-10-CM | POA: Diagnosis not present

## 2018-03-26 DIAGNOSIS — M25531 Pain in right wrist: Secondary | ICD-10-CM | POA: Diagnosis not present

## 2018-03-26 DIAGNOSIS — M6281 Muscle weakness (generalized): Secondary | ICD-10-CM | POA: Diagnosis not present

## 2018-03-26 DIAGNOSIS — S63501A Unspecified sprain of right wrist, initial encounter: Secondary | ICD-10-CM | POA: Diagnosis not present

## 2018-03-26 DIAGNOSIS — M25631 Stiffness of right wrist, not elsewhere classified: Secondary | ICD-10-CM | POA: Diagnosis not present

## 2018-03-28 DIAGNOSIS — M6281 Muscle weakness (generalized): Secondary | ICD-10-CM | POA: Diagnosis not present

## 2018-03-28 DIAGNOSIS — M25631 Stiffness of right wrist, not elsewhere classified: Secondary | ICD-10-CM | POA: Diagnosis not present

## 2018-03-28 DIAGNOSIS — M25531 Pain in right wrist: Secondary | ICD-10-CM | POA: Diagnosis not present

## 2018-03-28 DIAGNOSIS — S63501A Unspecified sprain of right wrist, initial encounter: Secondary | ICD-10-CM | POA: Diagnosis not present

## 2018-03-28 IMAGING — CR DG CERVICAL SPINE COMPLETE 4+V
5 series · 5 of 5 positions shown · non-contrast
Comparison: None.

CLINICAL DATA: Left side neck pain for 2 weeks

EXAM:
CERVICAL SPINE - COMPLETE 4+ VIEW

[w c-spine lat]
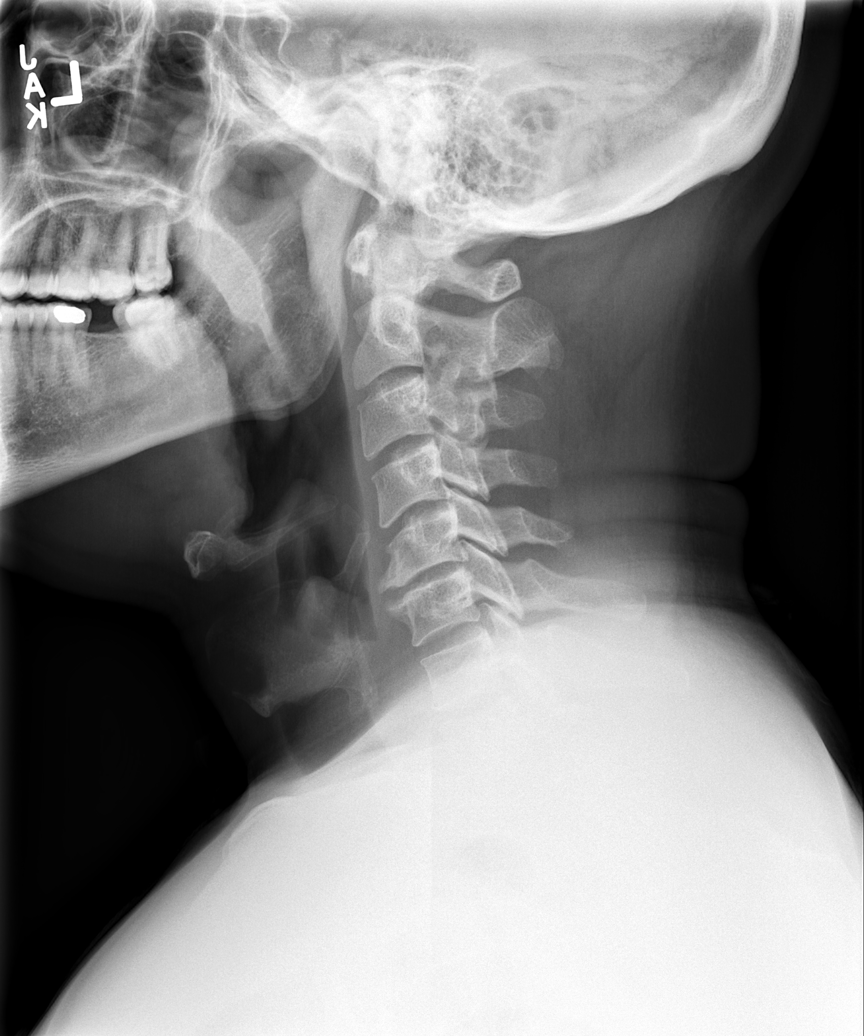

[w c-spine oblique (1 of 2)]
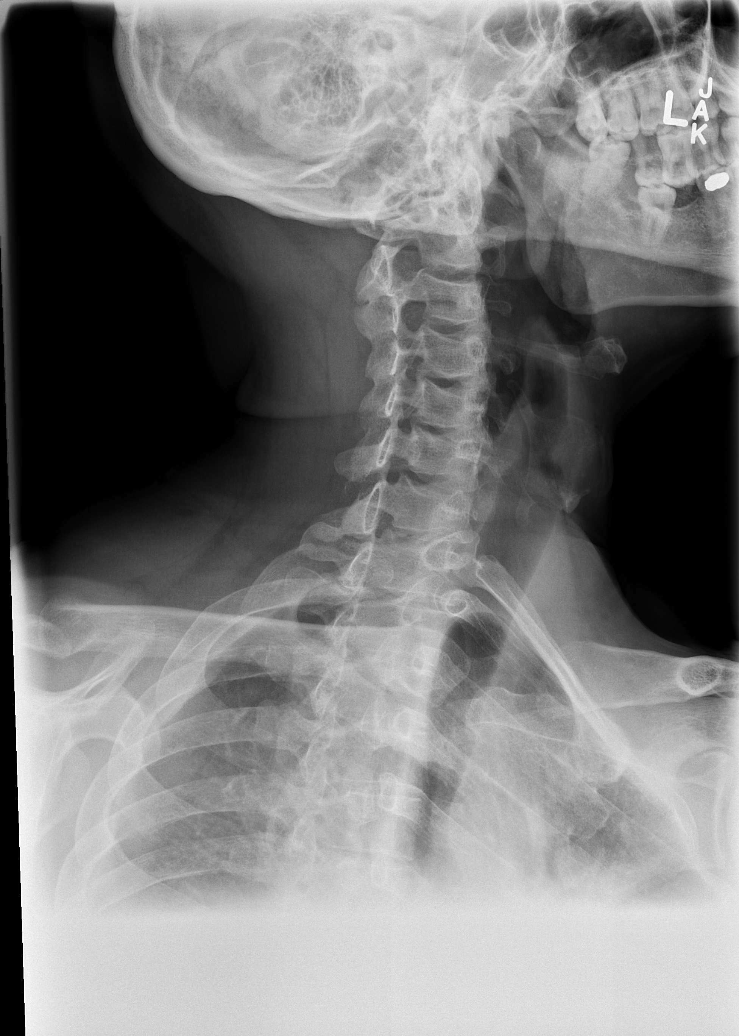

[w c-spine oblique (2 of 2)]
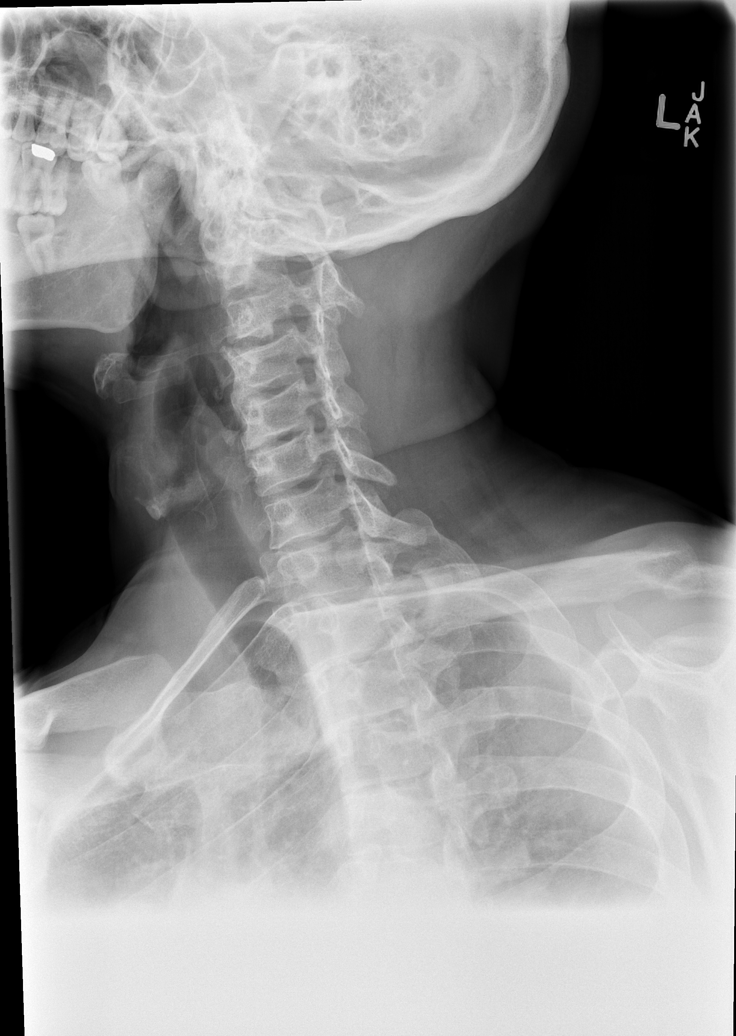

[w c-spine a.p. *]
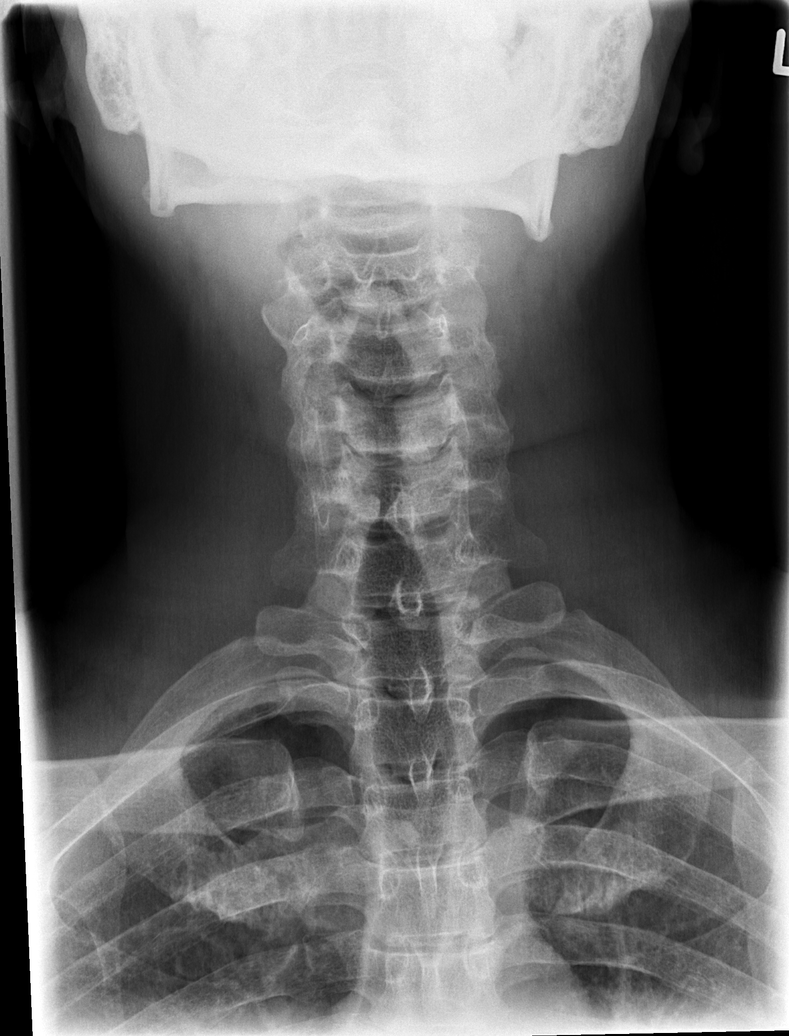

[w c-spine odontoid *]
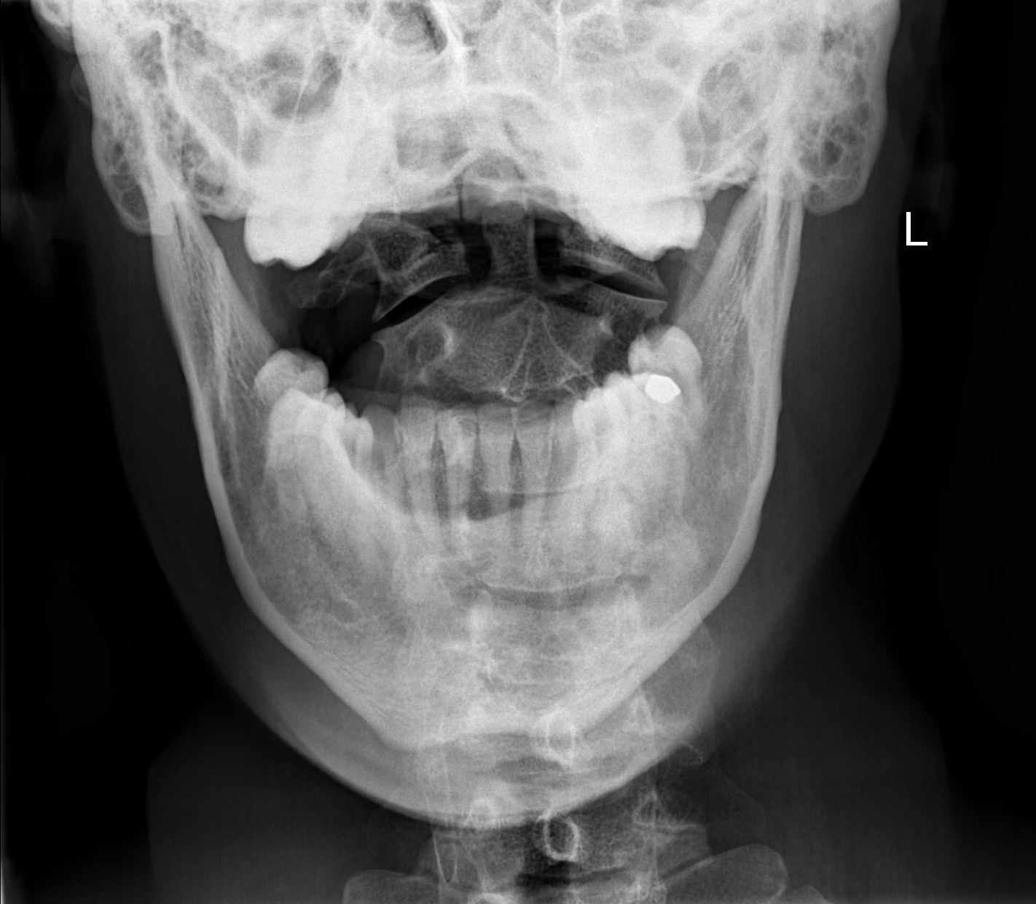

[5 of 5 positions shown; findings below may reference images not displayed]

FINDINGS: Five views of the cervical spine submitted. No acute fracture or
subluxation. Mild disc space flattening with moderate anterior
spurring at C5-C6 level. Alignment and vertebral body heights are
preserved. C1-C2 relationship is unremarkable. Cervical airway is
patent. No prevertebral soft tissue swelling.
IMPRESSION: No acute fracture or subluxation. Degenerative changes at C5-C6
level.

## 2018-04-01 ENCOUNTER — Ambulatory Visit (INDEPENDENT_AMBULATORY_CARE_PROVIDER_SITE_OTHER): Payer: BLUE CROSS/BLUE SHIELD | Admitting: Family Medicine

## 2018-04-01 ENCOUNTER — Encounter: Payer: Self-pay | Admitting: Family Medicine

## 2018-04-01 VITALS — BP 128/70 | HR 92 | Temp 98.9°F | Ht 70.0 in | Wt 216.6 lb

## 2018-04-01 DIAGNOSIS — R58 Hemorrhage, not elsewhere classified: Secondary | ICD-10-CM

## 2018-04-01 NOTE — Patient Instructions (Signed)
Ice the area at the lateral knee for 1-2 days. Expect the bruising to spread down the leg before it starts to clear. It will appear discolored down the leg, but shouldn't hurt. I think that using compression over the area at the knee will help with any discomfort (ACE wrap or a knee brace/neoprene sleeve).  I'm not exactly sure of the cause of the trauma that caused the bleeding under the skin and small hematoma.  There is evidence of any significant infection, inflammation or other problem, just the resultant bleeding under the skin.     Hematoma A hematoma is a collection of blood. The collection of blood can turn into a hard, painful lump under the skin. Your skin may turn blue or yellow if the hematoma is close to the surface of the skin. Most hematomas get better in a few days to weeks. Some hematomas are serious and need medical care. Hematomas can be very small or very big. Follow these instructions at home:  Apply ice to the injured area: ? Put ice in a plastic bag. ? Place a towel between your skin and the bag. ? Leave the ice on for 20 minutes, 2-3 times a day for the first 1 to 2 days.  After the first 2 days, switch to using warm packs on the injured area.  Raise (elevate) the injured area to lessen pain and puffiness (swelling). You may also wrap the area with an elastic bandage. Make sure the bandage is not wrapped too tight.  If you have a painful hematoma on your leg or foot, you may use crutches for a couple days.  Only take medicines as told by your doctor. Get help right away if:  Your pain gets worse.  Your pain is not controlled with medicine.  You have a fever.  Your puffiness gets worse.  Your skin turns more blue or yellow.  Your skin over the hematoma breaks or starts bleeding.  Your hematoma is in your chest or belly (abdomen) and you are short of breath, feel weak, or have a change in consciousness.  Your hematoma is on your scalp and you have a  headache that gets worse or a change in alertness or consciousness. This information is not intended to replace advice given to you by your health care provider. Make sure you discuss any questions you have with your health care provider. Document Released: 01/04/2005 Document Revised: 05/04/2016 Document Reviewed: 05/07/2013 Elsevier Interactive Patient Education  2017 [employer]

## 2018-04-01 NOTE — Progress Notes (Signed)
Chief Complaint  Patient presents with  . Insect Bite    right leg has a bruise-like place on it that started Fri after work. Does not remember bumping his leg or falling. Noticed that something was wrong Thursday. Had surgery on this knee in 2010. Not really painful.     4 days ago he noticed a slight discomfort at his right lateral knee.  Thought it was related to weather changes in the joint.  The next day, while rubbing it he felt a lump. Later that night he noticed a bruise.  Over the weekend the bruising got darker in color, no change in size.  No known injury/trauma. Does recall thinking he felt something, like maybe an insect bite or something stinging (never saw anything, just based on the bump he felt).  This area is not itchy.  Only tender over the raised bump, not over the bruise.  The bump had been a larger "lump", smaller now. He is concerned about the cause, and how long it will take to resolve  PMH, PSH, SH reviewed  Current Outpatient Medications on File Prior to Visit  Medication Sig Dispense Refill  . gabapentin (NEURONTIN) 300 MG capsule Take 1 capsule (300 mg total) by mouth 3 (three) times daily. 90 capsule 2  . meloxicam (MOBIC) 7.5 MG tablet Take 7.5 mg by mouth daily.  2   No current facility-administered medications on file prior to visit.    Allergies  Allergen Reactions  . Chloraseptic Sore Throat [Acetaminophen] Itching and Other (See Comments)    ITCHING IN THROAT    ROS:  No fever, chills, rashes or other skin concerns other than that noted in HPI.  Denies any other bleeding, bruising.   No other complaints or concerns.  PHYSICAL EXAM:  BP 128/70   Pulse 92   Temp 98.9 F (37.2 C) (Tympanic)   Ht 5\' 10"  (1.778 m)   Wt 216 lb 9.6 oz (98.2 kg)   BMI 31.08 kg/m   Well appearing male, in no distress Right lateral knee: When standing, there is soft tissue swelling in 5-5.5 cm area, soft.  There is a slightly firmer, tender area at 2 o'clock position.   There is some bruising that is spreading inferiorly down the leg, only tender over the nodule/knot at 2 o'clock nontender elsewhere. Bruising 11 x 9 cm. He does have some superficial veins around the knee, nontender Skin has normal turgor, no bruises noted elsewhere. Normal sensation Normal gait Knee without effusion/swelling  ASSESSMENT/PLAN:  Ecchymosis - of RLE; suspect had hematoma, resolving, draining inferiorly (takes NSAIDs). Ice, compression, healing course discussed  NSAIDs like contribute to the amount of bleeding/bruising Reassured no e/o insect bite, infection or complication occurring. Discussed natural course (expect bruising to extend inferiorly, and color changes to expect to see).   Ice the area at the lateral knee for 1-2 days. Expect the bruising to spread down the leg before it starts to clear. It will appear discolored down the leg, but shouldn't hurt. I think that using compression over the area at the knee will help with any discomfort (ACE wrap or a knee brace/neoprene sleeve).  I'm not exactly sure of the cause of the trauma that caused the bleeding under the skin and small hematoma.  There is evidence of any significant infection, inflammation or other problem, just the resultant bleeding under the skin.

## 2018-04-02 DIAGNOSIS — M25531 Pain in right wrist: Secondary | ICD-10-CM | POA: Diagnosis not present

## 2018-04-02 DIAGNOSIS — M6281 Muscle weakness (generalized): Secondary | ICD-10-CM | POA: Diagnosis not present

## 2018-04-02 DIAGNOSIS — M25631 Stiffness of right wrist, not elsewhere classified: Secondary | ICD-10-CM | POA: Diagnosis not present

## 2018-04-02 DIAGNOSIS — S63501A Unspecified sprain of right wrist, initial encounter: Secondary | ICD-10-CM | POA: Diagnosis not present

## 2018-04-04 DIAGNOSIS — S63501A Unspecified sprain of right wrist, initial encounter: Secondary | ICD-10-CM | POA: Diagnosis not present

## 2018-04-04 DIAGNOSIS — M25531 Pain in right wrist: Secondary | ICD-10-CM | POA: Diagnosis not present

## 2018-04-04 DIAGNOSIS — M6281 Muscle weakness (generalized): Secondary | ICD-10-CM | POA: Diagnosis not present

## 2018-04-04 DIAGNOSIS — M25631 Stiffness of right wrist, not elsewhere classified: Secondary | ICD-10-CM | POA: Diagnosis not present

## 2018-04-09 DIAGNOSIS — M6281 Muscle weakness (generalized): Secondary | ICD-10-CM | POA: Diagnosis not present

## 2018-04-09 DIAGNOSIS — S63501A Unspecified sprain of right wrist, initial encounter: Secondary | ICD-10-CM | POA: Diagnosis not present

## 2018-04-09 DIAGNOSIS — M25631 Stiffness of right wrist, not elsewhere classified: Secondary | ICD-10-CM | POA: Diagnosis not present

## 2018-04-09 DIAGNOSIS — M25531 Pain in right wrist: Secondary | ICD-10-CM | POA: Diagnosis not present

## 2018-04-15 DIAGNOSIS — S63501A Unspecified sprain of right wrist, initial encounter: Secondary | ICD-10-CM | POA: Diagnosis not present

## 2018-04-16 DIAGNOSIS — M25631 Stiffness of right wrist, not elsewhere classified: Secondary | ICD-10-CM | POA: Diagnosis not present

## 2018-04-16 DIAGNOSIS — S63501A Unspecified sprain of right wrist, initial encounter: Secondary | ICD-10-CM | POA: Diagnosis not present

## 2018-04-16 DIAGNOSIS — M6281 Muscle weakness (generalized): Secondary | ICD-10-CM | POA: Diagnosis not present

## 2018-04-16 DIAGNOSIS — M25531 Pain in right wrist: Secondary | ICD-10-CM | POA: Diagnosis not present

## 2018-04-18 DIAGNOSIS — M6281 Muscle weakness (generalized): Secondary | ICD-10-CM | POA: Diagnosis not present

## 2018-04-18 DIAGNOSIS — M25531 Pain in right wrist: Secondary | ICD-10-CM | POA: Diagnosis not present

## 2018-04-18 DIAGNOSIS — M25631 Stiffness of right wrist, not elsewhere classified: Secondary | ICD-10-CM | POA: Diagnosis not present

## 2018-04-18 DIAGNOSIS — S63501A Unspecified sprain of right wrist, initial encounter: Secondary | ICD-10-CM | POA: Diagnosis not present

## 2018-04-23 DIAGNOSIS — Z6830 Body mass index (BMI) 30.0-30.9, adult: Secondary | ICD-10-CM | POA: Diagnosis not present

## 2018-04-23 DIAGNOSIS — M5127 Other intervertebral disc displacement, lumbosacral region: Secondary | ICD-10-CM | POA: Diagnosis not present

## 2018-04-30 DIAGNOSIS — M25631 Stiffness of right wrist, not elsewhere classified: Secondary | ICD-10-CM | POA: Diagnosis not present

## 2018-04-30 DIAGNOSIS — M25531 Pain in right wrist: Secondary | ICD-10-CM | POA: Diagnosis not present

## 2018-04-30 DIAGNOSIS — M6281 Muscle weakness (generalized): Secondary | ICD-10-CM | POA: Diagnosis not present

## 2018-04-30 DIAGNOSIS — S63501A Unspecified sprain of right wrist, initial encounter: Secondary | ICD-10-CM | POA: Diagnosis not present

## 2018-05-02 DIAGNOSIS — M25631 Stiffness of right wrist, not elsewhere classified: Secondary | ICD-10-CM | POA: Diagnosis not present

## 2018-05-02 DIAGNOSIS — S63501A Unspecified sprain of right wrist, initial encounter: Secondary | ICD-10-CM | POA: Diagnosis not present

## 2018-05-02 DIAGNOSIS — M6281 Muscle weakness (generalized): Secondary | ICD-10-CM | POA: Diagnosis not present

## 2018-05-02 DIAGNOSIS — M25531 Pain in right wrist: Secondary | ICD-10-CM | POA: Diagnosis not present

## 2018-05-09 DIAGNOSIS — M6281 Muscle weakness (generalized): Secondary | ICD-10-CM | POA: Diagnosis not present

## 2018-05-09 DIAGNOSIS — M25531 Pain in right wrist: Secondary | ICD-10-CM | POA: Diagnosis not present

## 2018-05-09 DIAGNOSIS — S63501A Unspecified sprain of right wrist, initial encounter: Secondary | ICD-10-CM | POA: Diagnosis not present

## 2018-05-09 DIAGNOSIS — M25631 Stiffness of right wrist, not elsewhere classified: Secondary | ICD-10-CM | POA: Diagnosis not present

## 2018-05-17 DIAGNOSIS — S63501A Unspecified sprain of right wrist, initial encounter: Secondary | ICD-10-CM | POA: Diagnosis not present

## 2018-05-17 DIAGNOSIS — M25631 Stiffness of right wrist, not elsewhere classified: Secondary | ICD-10-CM | POA: Diagnosis not present

## 2018-05-17 DIAGNOSIS — M25531 Pain in right wrist: Secondary | ICD-10-CM | POA: Diagnosis not present

## 2018-05-17 DIAGNOSIS — M6281 Muscle weakness (generalized): Secondary | ICD-10-CM | POA: Diagnosis not present

## 2018-05-27 DIAGNOSIS — S63501A Unspecified sprain of right wrist, initial encounter: Secondary | ICD-10-CM | POA: Diagnosis not present

## 2018-07-05 DIAGNOSIS — M5137 Other intervertebral disc degeneration, lumbosacral region: Secondary | ICD-10-CM | POA: Diagnosis not present

## 2018-07-05 DIAGNOSIS — M435X6 Other recurrent vertebral dislocation, lumbar region: Secondary | ICD-10-CM | POA: Diagnosis not present

## 2018-07-05 DIAGNOSIS — R030 Elevated blood-pressure reading, without diagnosis of hypertension: Secondary | ICD-10-CM | POA: Diagnosis not present

## 2018-11-23 ENCOUNTER — Encounter (HOSPITAL_COMMUNITY): Payer: Self-pay | Admitting: Emergency Medicine

## 2018-11-23 ENCOUNTER — Emergency Department (HOSPITAL_COMMUNITY)
Admission: EM | Admit: 2018-11-23 | Discharge: 2018-11-23 | Disposition: A | Discharge status: Home / Self Care | Payer: BLUE CROSS/BLUE SHIELD | Attending: Emergency Medicine | Admitting: Emergency Medicine

## 2018-11-23 ENCOUNTER — Emergency Department (HOSPITAL_COMMUNITY): Payer: BLUE CROSS/BLUE SHIELD

## 2018-11-23 DIAGNOSIS — F17210 Nicotine dependence, cigarettes, uncomplicated: Secondary | ICD-10-CM | POA: Diagnosis not present

## 2018-11-23 DIAGNOSIS — R200 Anesthesia of skin: Secondary | ICD-10-CM | POA: Insufficient documentation

## 2018-11-23 DIAGNOSIS — M5442 Lumbago with sciatica, left side: Secondary | ICD-10-CM | POA: Diagnosis not present

## 2018-11-23 DIAGNOSIS — M545 Low back pain: Secondary | ICD-10-CM | POA: Diagnosis not present

## 2018-11-23 DIAGNOSIS — M549 Dorsalgia, unspecified: Secondary | ICD-10-CM | POA: Diagnosis not present

## 2018-11-23 LAB — URINALYSIS, ROUTINE W REFLEX MICROSCOPIC
Bilirubin Urine: NEGATIVE
Glucose, UA: NEGATIVE mg/dL
Hgb urine dipstick: NEGATIVE
KETONES UR: NEGATIVE mg/dL
LEUKOCYTES UA: NEGATIVE
NITRITE: NEGATIVE
PH: 7 (ref 5.0–8.0)
PROTEIN: NEGATIVE mg/dL
Specific Gravity, Urine: 1.001 — ABNORMAL LOW (ref 1.005–1.030)

## 2018-11-23 MED ORDER — IBUPROFEN 800 MG PO TABS: 800.0000 mg | ORAL_TABLET | Freq: Three times a day (TID) | ORAL | 0 refills | Status: DC | PRN

## 2018-11-23 MED ORDER — OXYCODONE-ACETAMINOPHEN 5-325 MG PO TABS: 1.0000 | ORAL_TABLET | Freq: Four times a day (QID) | ORAL | 0 refills | Status: DC | PRN

## 2018-11-23 MED ORDER — OXYCODONE-ACETAMINOPHEN 5-325 MG PO TABS
2.0000 | ORAL_TABLET | Freq: Once | ORAL | Status: AC
  Administered 2018-11-23: 2 via ORAL
  Filled 2018-11-23: qty 2

## 2018-11-23 MED ORDER — PREDNISONE 20 MG PO TABS: 40.0000 mg | ORAL_TABLET | Freq: Every day | ORAL | 0 refills | Status: DC

## 2018-11-23 NOTE — ED Notes (Signed)
Patient transported to MRI 

## 2018-11-23 NOTE — ED Notes (Signed)
Post void residual- 0 ml.

## 2018-11-23 NOTE — Discharge Instructions (Signed)

## 2018-11-23 NOTE — ED Triage Notes (Signed)
Pt reports back pain and left leg numbness that is causing difficulty walking, states he took ibuprofen this morning but it hasn't helped. Reports 3 back surgeries last year.

## 2018-11-23 NOTE — ED Notes (Signed)
Pt aware that a post void residual is needed, will press call light after he has urinated.

## 2018-11-23 NOTE — ED Provider Notes (Addendum)
Emergency Department Provider Note   I have reviewed the triage vital signs and the nursing notes.   HISTORY  Chief Complaint Back Pain   HPI Sergio Boyd is a 47 y.o. male with PMH of herniated disc with prior lumbar radiculopathy and pseudomeningocele requiring drainage with Dr. Bevely Palmer in 2018 months to the emergency department for evaluation of acute on chronic back pain.  Symptoms worsening over the past 4 to 5 days.  He has developed midline lower back pain radiating to the left.  Over the past 2 days he has developed progressively worsening left leg numbness and weakness.  He describes urinary frequency but no hesitancy or retention/incontinence symptoms.  Denies groin numbness, fevers, chills.  No injury to the back.  Patient states he is currently having significant discomfort which is limiting his walking ability.  He has been taking Motrin at home for pain.  Patient has a Tylenol allergy listed but states this is only for the "spray" tylenol and that he does ok with tylenol pills. Pain is severe, constant, and worse with movement.    Past Medical History:  Diagnosis Date  . Arthritis   . Chronic back pain    herniated disc  . GERD (gastroesophageal reflux disease)   . Headache   . History of migraine   . Joint pain     Patient Active Problem List   Diagnosis Date Noted  . 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 External hemorrhoid 04/17/2017  . Internal hemorrhoids 04/17/2017  . Chronic neck pain 04/17/2017  . Headache syndrome 04/17/2017  . Chronic low back pain 04/17/2017  . Localized swelling of back 04/17/2017  . H/O lumbosacral spine surgery 04/17/2017    Past Surgical History:  Procedure Laterality Date  . INGUINAL HERNIA REPAIR Right   . INGUINAL HERNIA REPAIR Right 11/23/2015   Procedure: RIGHT INGUINAL HERNIA REPAIR W/MESH;  Surgeon: Avel Peace, MD;  Location[facility] OR;  Service: General;  Laterality: Right;  . INSERTION OF MESH Right  11/23/2015   Procedure: INSERTION OF MESH;  Surgeon: Avel Peace, MD;  Location: [facility] OR;  Service: General;  Laterality: Right;  . KNEE SURGERY     right ACL 2012  . LUMBAR LAMINECTOMY/DECOMPRESSION MICRODISCECTOMY N/A 04/20/2017   Procedure: Re Operative Right Lumbar five-Sacral one Laminectomy/Recurrent discectomy, Left Lumbar four-five Laminectomy for decompression, pseudomeningocele repair;  Surgeon: Ditty, Loura Halt, MD;  Location: [facility] OR;  Service: Neurosurgery;  Laterality: N/A;  . LUMBAR SPINE SURGERY  02/2017   Dr. Bevely Palmer  . LUMBAR WOUND DEBRIDEMENT N/A 04/24/2017   Procedure: Repair of Pseudomeningocele and Placement of Lumbar Drain;  Surgeon: Ditty, Loura Halt, MD;  Location: [facility] OR;  Service: Neurosurgery;  Laterality: N/A;  . PLACEMENT OF LUMBAR DRAIN N/A 04/24/2017   Procedure: PLACEMENT OF LUMBAR DRAIN;  Surgeon: Ditty, Loura Halt, MD;  Location: [facility] OR;  Service: Neurosurgery;  Laterality: N/A;   Allergies Chloraseptic sore throat [acetaminophen]  History reviewed. No pertinent family history.  Social History Social History   Tobacco Use  . Smoking status: Current Every Day Smoker    Packs/day: 0.50    Years: 10.00    Pack years: 5.00  . Smokeless tobacco: Never Used  Substance Use Topics  . Alcohol use: No  . Drug use: No    Review of Systems  Constitutional: No fever/chills Eyes: No visual changes. ENT: No sore throat. Cardiovascular: Denies chest pain. Respiratory: Denies shortness of breath. Gastrointestinal: No abdominal pain.  No nausea, no vomiting.  No diarrhea.  No constipation. Genitourinary: Negative for dysuria. Musculoskeletal: Positive back pain and left leg pain.  Skin: Negative for rash. Neurological: Negative for headaches. Positive left leg weakness and numbness.   10-point ROS otherwise negative.  ____________________________________________   PHYSICAL EXAM:  VITAL SIGNS: ED Triage Vitals  Enc Vitals Group     BP  11/23/18 1202 (!) 141/102     Pulse Rate 11/23/18 1202 (!) 53     Resp --      Temp 11/23/18 1202 98 F (36.7 C)     Temp Source 11/23/18 1202 Oral     SpO2 11/23/18 1202 99 %     Weight 11/23/18 1200 215 lb (97.5 kg)     Height 11/23/18 1200 5\' 8"  (1.727 m)     Pain Score 11/23/18 1200 10   Constitutional: Alert and oriented. Well appearing and in no acute distress. Eyes: Conjunctivae are normal.  Head: Atraumatic. Nose: No congestion/rhinnorhea. Mouth/Throat: Mucous membranes are moist.  Neck: No stridor.   Cardiovascular: Normal rate, regular rhythm. Good peripheral circulation. Grossly normal heart sounds.   Respiratory: Normal respiratory effort.  No retractions. Lungs CTAB. Gastrointestinal: Soft and nontender. No distention.  Musculoskeletal: No lower extremity tenderness nor edema. No gross deformities of extremities. Neurologic:  Normal speech and language. Decreased sensation to light touch over the entire left leg. 4+/5 strength in the LLE. 2+ patellar reflexes bilaterally.  Skin:  Skin is warm, dry and intact. No rash noted.  ____________________________________________   LABS (all labs ordered are listed, but only abnormal results are displayed)  Labs Reviewed  URINALYSIS, ROUTINE W REFLEX MICROSCOPIC - Abnormal; Notable for the following components:      Result Value   Color, Urine COLORLESS (*)    Specific Gravity, Urine 1.001 (*)    All other components within normal limits   ____________________________________________  RADIOLOGY  Mr Lumbar Spine Wo Contrast  Result Date: 11/23/2018 CLINICAL DATA:  47 year old male with back pain, left leg numbness. Difficulty walking. Multiple prior back surgeries. EXAM: MRI LUMBAR SPINE WITHOUT CONTRAST TECHNIQUE: Multiplanar, multisequence MR imaging of the lumbar spine was performed. No intravenous contrast was administered. COMPARISON:  Lumbar MRI 09/13/2017.  Lumbar radiographs 07/05/2018. FINDINGS: Segmentation:  Normal on the comparison radiographs which is the same numbering system used on the 2018 MRI. Alignment: Stable lumbar lordosis. Subtle retrolisthesis of L5 on S1. Vertebrae: No acute osseous abnormality identified. Visualized bone marrow signal is within normal limits. Intact visible sacrum and SI joints. Conus medullaris and cauda equina: Conus extends to the T12-L1 level. No lower spinal cord or conus signal abnormality. Paraspinal and other soft tissues: Negative visible abdominal viscera. Mild postoperative changes to the lower lumbar posterior paraspinal soft tissues. No postoperative fluid collection. Disc levels: T11-T12: Mild facet hypertrophy. T12-L1:  Negative. L1-L2:  Negative. L2-L3:  Negative. L3-L4:  Borderline to mild facet hypertrophy. L4-L5: Chronic disc desiccation and disc space loss. Chronic but perhaps mildly increased central disc protrusion with annular fissure (series 8, image 24). Mild superimposed facet and ligament flavum hypertrophy. However, mild to moderate left lateral recess stenosis appears stable (left L5 nerve level). Stable mild spinal stenosis. No foraminal involvement. L5-S1: Chronic postoperative changes to the lamina. Chronic disc desiccation and disc space loss with chronic lobulated posterior disc bulging. New 12 millimeter left lateral recess extruded disc fragment best seen on series 5, image 10 (series 8, image 26). New severe left lateral recess stenosis (left S1 nerve level). Interval improved patency of the right lateral recess. No  spinal stenosis. Stable mild left L5 foraminal stenosis. IMPRESSION: 1. Symptomatic abnormality appears to be a 12 mm extruded disc fragment in the left lateral recess of L5-S1, new since the 2018 MRI. Query Left S1 radiculitis. Right lateral recess patency at that level has normalized since 2018. 2. Chronic L4-L5 disc degeneration also with perhaps mildly increased central disc protrusion but stable mild-to-moderate left L5 nerve level  lateral recess stenosis and mild spinal stenosis. Electronically Signed   By: Odessa FlemingH  Hall M.D.   On: 11/23/2018 14:48    ____________________________________________   PROCEDURES  Procedure(s) performed:   Procedures  None ____________________________________________   INITIAL IMPRESSION / ASSESSMENT AND PLAN / ED COURSE  Pertinent labs & imaging results that were available during my care of the patient were reviewed by me and considered in my medical decision making (see chart for details).  Patient with Merna Baldi history of back pain including herniated disc and pseudomeningocele requiring drainage in 2018 presents with acutely worsening back pain over the past several days with worsening numbness and weakness in the left lower extremity.  Patient's neuro exam with decreased sensation to light touch diffusely over the leg and decreased strength as well.  Given his history and exam with acutely worsening symptoms I do feel he warrants emergent MRI to rule out cord compression and/or cauda equina.   MRI reviewed. No cord compression or cauda equina. Patient with disc protrusion. Normal UA. Normal post-void residual. Plan for steroids and Neurosurgery follow up on Monday. Discussed results and follow up plan along with ED return precautions.  ____________________________________________  FINAL CLINICAL IMPRESSION(S) / ED DIAGNOSES  Final diagnoses:  Acute left-sided low back pain with left-sided sciatica  Left leg numbness     MEDICATIONS GIVEN DURING THIS VISIT:  Medications  oxyCODONE-acetaminophen (PERCOCET/ROXICET) 5-325 MG per tablet 2 tablet (2 tablets Oral Given 11/23/18 1218)     NEW OUTPATIENT MEDICATIONS STARTED DURING THIS VISIT:  Discharge Medication List as of 11/23/2018  3:06 PM    START taking these medications   Details  ibuprofen (ADVIL,MOTRIN) 800 MG tablet Take 1 tablet (800 mg total) by mouth every 8 (eight) hours as needed for moderate pain., Starting Sat  11/23/2018, Print    oxyCODONE-acetaminophen (PERCOCET/ROXICET) 5-325 MG tablet Take 1 tablet by mouth every 6 (six) hours as needed for severe pain., Starting Sat 11/23/2018, Print    predniSONE (DELTASONE) 20 MG tablet Take 2 tablets (40 mg total) by mouth daily for 5 days., Starting Sat 11/23/2018, Until Thu 11/28/2018, Print        Note:  This document was prepared using Dragon voice recognition software and may include unintentional dictation errors.  Alona BeneJoshua Dainelle Hun, MD Emergency Medicine    Gracelyn Coventry, Arlyss RepressJoshua G, MD 11/23/18 2018    Maia PlanLong, Vaunda Gutterman G, MD 11/23/18 2021

## 2018-11-23 NOTE — ED Notes (Signed)
Discharge instructions and prescriptions discussed with Pt. Pt verbalized understanding. Pt stable and ambulatory.   

## 2018-11-28 ENCOUNTER — Encounter: Payer: Self-pay | Admitting: Medical

## 2018-11-28 ENCOUNTER — Ambulatory Visit (INDEPENDENT_AMBULATORY_CARE_PROVIDER_SITE_OTHER): Payer: BLUE CROSS/BLUE SHIELD | Admitting: Medical

## 2018-11-28 VITALS — BP 120/80 | HR 68 | Temp 97.7°F | Resp 16 | Ht 70.0 in | Wt 216.2 lb

## 2018-11-28 DIAGNOSIS — M79605 Pain in left leg: Secondary | ICD-10-CM

## 2018-11-28 DIAGNOSIS — M5410 Radiculopathy, site unspecified: Secondary | ICD-10-CM

## 2018-11-28 DIAGNOSIS — G9782 Other postprocedural complications and disorders of nervous system: Secondary | ICD-10-CM

## 2018-11-28 DIAGNOSIS — M545 Low back pain: Secondary | ICD-10-CM

## 2018-11-28 DIAGNOSIS — R202 Paresthesia of skin: Secondary | ICD-10-CM

## 2018-11-28 DIAGNOSIS — Z9889 Other specified postprocedural states: Secondary | ICD-10-CM

## 2018-11-28 DIAGNOSIS — G8929 Other chronic pain: Secondary | ICD-10-CM

## 2018-11-28 MED ORDER — OXYCODONE-ACETAMINOPHEN 5-325 MG PO TABS: 1.0000 | ORAL_TABLET | Freq: Four times a day (QID) | ORAL | 0 refills | Status: DC | PRN

## 2018-11-28 MED ORDER — IBUPROFEN 800 MG PO TABS: 800.0000 mg | ORAL_TABLET | Freq: Three times a day (TID) | ORAL | 0 refills | Status: DC | PRN

## 2018-11-28 NOTE — Progress Notes (Signed)
Subjective: Chief Complaint  Patient presents with  . follow up    follow up ER back pain    Here for emergency dept f/u on back pain.   I reviewed the emergency department notes from 11/23/2018 5 days ago.  He was emergency department for chronic back pain, worse pain in the last 4 to 5 days prior to ED visit including midline lower back pain radiating to the left.  Worsening left numbness and weakness.  He has a history of lumbar radiculopathy, prior pseudomeningocele requiring drainage with Dr. Bevely Palmeritty in 2018.  At the time of the emergency department he noted that he had limitations with walking due to pain, had severe pain, worse with movement, at the time was taking Motrin at home.  Currently he requested to nurse that I give him a month supply of oxycodone.  Is more comfortable than when he went to ED.  Is able to sleep, but still having lots of pain, not able to go back to work yet.  He reports taking Prednisone and Ibuprofen.  Taking oxycodone as well.       Does detailing on planes and jets.   Not a lot of heavy lifting on the job.   No recent fall or injury.   No recent trauma.    Denies fever, no bowel issues.  Is feeling urinary urgency when he has to go right when he feels the urge.  Past Medical History:  Diagnosis Date  . Arthritis   . Chronic back pain    herniated disc  . GERD (gastroesophageal reflux disease)   . Headache   . History of migraine   . Joint pain    Past Surgical History:  Procedure Laterality Date  . INGUINAL HERNIA REPAIR Right   . INGUINAL HERNIA REPAIR Right 11/23/2015   Procedure: RIGHT INGUINAL HERNIA REPAIR W/MESH;  Surgeon: Avel Peaceodd Rosenbower, MD;  [facility];  Service: General;  Laterality: Right;  . INSERTION OF MESH Right 11/23/2015   Procedure: INSERTION OF MESH;  Surgeon: Avel Peaceodd Rosenbower, MD;  [facility];  Service: General;  Laterality: Right;  . KNEE SURGERY     right ACL 2012  . LUMBAR LAMINECTOMY/DECOMPRESSION MICRODISCECTOMY  N/A 04/20/2017   Procedure: Re Operative Right Lumbar five-Sacral one Laminectomy/Recurrent discectomy, Left Lumbar four-five Laminectomy for decompression, pseudomeningocele repair;  Surgeon: Ditty, Loura HaltBenjamin Jared, MD;  [facility];  Service: Neurosurgery;  Laterality: N/A;  . LUMBAR SPINE SURGERY  02/2017   Dr. Bevely Palmeritty  . LUMBAR WOUND DEBRIDEMENT N/A 04/24/2017   Procedure: Repair of Pseudomeningocele and Placement of Lumbar Drain;  Surgeon: Ditty, Loura HaltBenjamin Jared, MD;  [facility];  Service: Neurosurgery;  Laterality: N/A;  . PLACEMENT OF LUMBAR DRAIN N/A 04/24/2017   Procedure: PLACEMENT OF LUMBAR DRAIN;  Surgeon: Ditty, Loura HaltBenjamin Jared, MD;  Location: [facility] OR;  Service: Neurosurgery;  Laterality: N/A;   ROS as in subjective   Objective: BP 120/80   Pulse 68   Temp 97.7 F (36.5 C) (Oral)   Resp 16   Ht 5\' 10"  (1.778 m)   Wt 216 lb 3.2 oz (98.1 kg)   SpO2 97%   BMI 31.02 kg/m   Gen: wd, wn, nad, AA male Skin: unremarkable Tender left lumbar region, limited ROM due to pain, no scoliosis or deformity MSK: pain in left lumbar region with left hip external ROM, otherwise legs nontender, normal ROM, no swelling or deformity Legs with normal pulses, strength WNL, but seems a little reduced on left  compared to right, normal DTRs bilat, slightly decreased sensation left lateral lower leg Abdomen: nontender, no mass     Assessment: Encounter Diagnoses  Name Primary?  . Chronic low back pain, unspecified back pain laterality, unspecified whether sciatica present Yes  . H/O lumbosacral spine surgery   . Postprocedural pseudomeningocele   . Radicular low back pain   . Left leg pain   . Paresthesia       Plan: I reviewed his emergency department notes and he was prescribed ibuprofen 800 mg every 8 hours, oxycodone every 6 hours as needed for severe pain, prednisone was also prescribed  MRI Lumbar Spine 11/23/18: IMPRESSION: 1. Symptomatic abnormality appears to be a 12 mm  extruded disc fragment in the left lateral recess of L5-S1, new since the 2018 MRI. Query Left S1 radiculitis. Right lateral recess patency at that level has normalized since 2018. 2. Chronic L4-L5 disc degeneration also with perhaps mildly increased central disc protrusion but stable mild-to-moderate left L5 nerve level lateral recess stenosis and mild spinal stenosis.  Refill medication as below, gave note for light duty for the next 7 days, referral back to neurosurgery.   Sergio Boyd was seen today for follow up.  Diagnoses and all orders for this visit:  Chronic low back pain, unspecified back pain laterality, unspecified whether sciatica present -     Ambulatory referral to Neurosurgery  H/O lumbosacral spine surgery -     Ambulatory referral to Neurosurgery  Postprocedural pseudomeningocele -     Ambulatory referral to Neurosurgery  Radicular low back pain -     Ambulatory referral to Neurosurgery  Left leg pain -     Ambulatory referral to Neurosurgery  Paresthesia -     Ambulatory referral to Neurosurgery  Other orders -     ibuprofen (ADVIL,MOTRIN) 800 MG tablet; Take 1 tablet (800 mg total) by mouth every 8 (eight) hours as needed for moderate pain. -     oxyCODONE-acetaminophen (PERCOCET/ROXICET) 5-325 MG tablet; Take 1 tablet by mouth every 6 (six) hours as needed for severe pain.

## 2018-11-29 DIAGNOSIS — M435X6 Other recurrent vertebral dislocation, lumbar region: Secondary | ICD-10-CM | POA: Diagnosis not present

## 2018-12-20 ENCOUNTER — Telehealth: Payer: Self-pay | Admitting: Medical

## 2018-12-20 NOTE — Telephone Encounter (Signed)
Pt needs refills on Ibuprofen 800 Mg and Oxycodone. [facility] uses the [facility] [address]

## 2018-12-20 NOTE — Telephone Encounter (Signed)
What is status of his referral/neurosurgery appt?  I cant keep refilling pain medication, but need to know when appt is scheduled

## 2018-12-20 NOTE — Telephone Encounter (Signed)
Patient is already patient of Dr Lovell Sheehan and has an appt in January 03 2019.

## 2018-12-23 NOTE — Telephone Encounter (Signed)
He is seeing neurosurgery, and they should be the ones handling the refills.  He needs to call their office

## 2018-12-23 NOTE — Telephone Encounter (Signed)
Patient states that he needs refill on his 800mg  ibuprofen and oxycodone.  Patient has already seen on Nov 29 2018 and has an appointment on 01-07-19 at 430.  Patient states he doesn't have an appointment until March 2020.   I called to get medical record sent to you.   Please advise.  Thank you

## 2018-12-24 NOTE — Telephone Encounter (Signed)
Patient notified

## 2018-12-26 ENCOUNTER — Telehealth: Payer: Self-pay | Admitting: Medical

## 2018-12-26 NOTE — Telephone Encounter (Signed)
Dismissal letter in guarantor snapshot  °

## 2019-01-16 ENCOUNTER — Emergency Department (HOSPITAL_COMMUNITY)
Admission: EM | Admit: 2019-01-16 | Discharge: 2019-01-16 | Disposition: A | Discharge status: Home / Self Care | Payer: Self-pay | Attending: Emergency Medicine | Admitting: Emergency Medicine

## 2019-01-16 ENCOUNTER — Emergency Department (HOSPITAL_COMMUNITY): Payer: Self-pay

## 2019-01-16 ENCOUNTER — Emergency Department (HOSPITAL_BASED_OUTPATIENT_CLINIC_OR_DEPARTMENT_OTHER): Payer: Self-pay

## 2019-01-16 ENCOUNTER — Other Ambulatory Visit: Payer: Self-pay

## 2019-01-16 ENCOUNTER — Encounter (HOSPITAL_COMMUNITY): Payer: Self-pay

## 2019-01-16 DIAGNOSIS — R2232 Localized swelling, mass and lump, left upper limb: Secondary | ICD-10-CM | POA: Insufficient documentation

## 2019-01-16 DIAGNOSIS — Z79899 Other long term (current) drug therapy: Secondary | ICD-10-CM | POA: Insufficient documentation

## 2019-01-16 DIAGNOSIS — M25512 Pain in left shoulder: Secondary | ICD-10-CM | POA: Diagnosis not present

## 2019-01-16 DIAGNOSIS — F17210 Nicotine dependence, cigarettes, uncomplicated: Secondary | ICD-10-CM | POA: Insufficient documentation

## 2019-01-16 DIAGNOSIS — M7989 Other specified soft tissue disorders: Secondary | ICD-10-CM

## 2019-01-16 MED ORDER — LIDOCAINE 5 % EX PTCH
1.0000 | MEDICATED_PATCH | CUTANEOUS | Status: DC
  Administered 2019-01-16: 1 via TRANSDERMAL
  Filled 2019-01-16: qty 1

## 2019-01-16 MED ORDER — LIDOCAINE 5 % EX PTCH: 1.0000 | MEDICATED_PATCH | CUTANEOUS | 0 refills | Status: DC

## 2019-01-16 MED ORDER — OXYCODONE-ACETAMINOPHEN 5-325 MG PO TABS
1.0000 | ORAL_TABLET | Freq: Once | ORAL | Status: AC
  Administered 2019-01-16: 1 via ORAL
  Filled 2019-01-16: qty 1

## 2019-01-16 NOTE — Discharge Instructions (Addendum)
You have been diagnosed today with Left shoulder pain.  At this time there does not appear to be the presence of an emergent medical condition, however there is always the potential for conditions to change. Please read and follow the below instructions.  Please return to the Emergency Department immediately for any new or worsening symptoms. Please be sure to follow up with your Primary Care Provider within one week regarding your visit today; please call their office to schedule an appointment even if you are feeling better for a follow-up visit. You may use the sling for comfort and to help protect your shoulder.  Over the next few days you may begin practicing range of motion of the shoulder to avoid stiffness. Please call the orthopedic specialist Dr. Luiz Blare at [facility] orthopedics to schedule a follow-up appointment regarding your left shoulder pain.  Get help right away if: Your arm, hand, or fingers: Tingle. Become numb. Become swollen. Become painful. Turn white or blue. Get help right away if: You cannot move the joint. Your fingers or toes tingle, become numb, or turn cold and blue. You have a fever along with a joint that is red, warm, and swollen. Please read the additional information packets attached to your discharge summary.  Do not take your medicine if  develop an itchy rash, swelling in your mouth or lips, or difficulty breathing.

## 2019-01-16 NOTE — Progress Notes (Signed)
Left upper extremity venous duplex exam completed. Please see preliminary notes on CV PROC under chart review/. Sergio Boyd H Derrian Rodak(RDMS RVT) 01/16/19 3:57 PM'

## 2019-01-16 NOTE — ED Triage Notes (Signed)
Patient reports that he woke up 2 days ago and his left shoulder was painful and had left arm swelling

## 2019-01-16 NOTE — ED Provider Notes (Signed)
[facility]-EMERGENCY DEPT Provider Note   CSN: [id number] Arrival date & time: 01/16/19  1914     History   Chief Complaint Chief Complaint  Patient presents with  . Shoulder Pain  . Arm Swelling    HPI Sergio Boyd is a 48 y.o. male presenting today for left shoulder pain.  Patient reports that 3 days ago he was stretching which included stretching his left shoulder, at that time patient reports he felt a mild twinge to the front of his shoulder that he did not think much about.  Patient states that he then went to bed and when he woke up he had increased left shoulder pain.  He describes a moderate intensity anterior shoulder pain that is constant and worsened with movement of the shoulder or palpation of the anterior deltoid.  Patient has been using hydrocodone that he is prescribed for his back pain with some relief of symptoms.  Patient denies numbness/weakness of the extremity, he denies fever, chills or color change.  Patient stated to nursing staff that he felt that his left anterior shoulder was swollen he denies swelling of the upper or lower arm.  HPI  Past Medical History:  Diagnosis Date  . Arthritis   . Chronic back pain    herniated disc  . GERD (gastroesophageal reflux disease)   . Headache   . History of migraine   . Joint pain     Patient Active Problem List   Diagnosis Date Noted  . Radicular low back pain 11/28/2018  . Left leg pain 11/28/2018  . Paresthesia 11/28/2018  . 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 External hemorrhoid 04/17/2017  . Internal hemorrhoids 04/17/2017  . Chronic neck pain 04/17/2017  . Headache syndrome 04/17/2017  . Chronic low back pain 04/17/2017  . Localized swelling of back 04/17/2017  . H/O lumbosacral spine surgery 04/17/2017    Past Surgical History:  Procedure Laterality Date  . INGUINAL HERNIA REPAIR Right   . INGUINAL HERNIA REPAIR Right 11/23/2015   Procedure: RIGHT INGUINAL  HERNIA REPAIR W/MESH;  Surgeon: Avel Peace, MD;  Location: [facility] OR;  Service: General;  Laterality: Right;  . INSERTION OF MESH Right 11/23/2015   Procedure: INSERTION OF MESH;  Surgeon: Avel Peace, MD;  Location: [facility] OR;  Service: General;  Laterality: Right;  . KNEE SURGERY     right ACL 2012  . LUMBAR LAMINECTOMY/DECOMPRESSION MICRODISCECTOMY N/A 04/20/2017   Procedure: Re Operative Right Lumbar five-Sacral one Laminectomy/Recurrent discectomy, Left Lumbar four-five Laminectomy for decompression, pseudomeningocele repair;  Surgeon: Ditty, Loura Halt, MD;  Location: [facility] OR;  Service: Neurosurgery;  Laterality: N/A;  . LUMBAR SPINE SURGERY  02/2017   Dr. Bevely Palmer  . LUMBAR WOUND DEBRIDEMENT N/A 04/24/2017   Procedure: Repair of Pseudomeningocele and Placement of Lumbar Drain;  Surgeon: Ditty, Loura Halt, MD;  Location: [facility] OR;  Service: Neurosurgery;  Laterality: N/A;  . PLACEMENT OF LUMBAR DRAIN N/A 04/24/2017   Procedure: PLACEMENT OF LUMBAR DRAIN;  Surgeon: Ditty, Loura Halt, MD;  Location: [facility] OR;  Service: Neurosurgery;  Laterality: N/A;        Home Medications    Prior to Admission medications   Medication Sig Start Date End Date Taking? Authorizing Provider  ibuprofen (ADVIL,MOTRIN) 800 MG tablet Take 1 tablet (800 mg total) by mouth every 8 (eight) hours as needed for moderate pain. 11/28/18  Yes Tysinger, Kermit Balo, PA-C  oxyCODONE-acetaminophen (PERCOCET/ROXICET) 5-325 MG tablet Take 1 tablet by mouth every 6 (six) hours as needed  for severe pain. 11/28/18  Yes Tysinger, Kermit Baloavid S, PA-C  lidocaine (LIDODERM) 5 % Place 1 patch onto the skin daily. Remove & Discard patch within 12 hours or as directed by MD 01/16/19   Bill SalinasMorelli, Kandon Hosking A, PA-C    Family History History reviewed. No pertinent family history.  Social History Social History   Tobacco Use  . Smoking status: Current Some Day Smoker    Packs/day: 0.50    Years: 10.00    Pack years: 5.00    Types:  Cigarettes  . Smokeless tobacco: Never Used  Substance Use Topics  . Alcohol use: No  . Drug use: No     Allergies   Chloraseptic sore throat [acetaminophen]   Review of Systems Review of Systems  Constitutional: Negative.  Negative for chills and fever.  Respiratory: Negative.  Negative for cough.   Cardiovascular: Negative.  Negative for chest pain.  Gastrointestinal: Negative.  Negative for abdominal pain, diarrhea, nausea and vomiting.  Musculoskeletal: Positive for arthralgias (Left Shoulder). Negative for neck pain and neck stiffness.  Skin: Negative.  Negative for color change and wound.  Neurological: Negative.  Negative for weakness, numbness and headaches.  All other systems reviewed and are negative.  Physical Exam Updated Vital Signs BP (!) 152/98 (BP Location: Right Arm)   Pulse (!) 58   Temp 98 F (36.7 C) (Oral)   Resp 18   Ht 5\' 10"  (1.778 m)   Wt 99.8 kg   SpO2 97%   BMI 31.57 kg/m   Physical Exam Constitutional:      General: He is not in acute distress.    Appearance: Normal appearance. He is well-developed. He is not ill-appearing or diaphoretic.  HENT:     Head: Normocephalic and atraumatic.     Right Ear: External ear normal.     Left Ear: External ear normal.     Nose: Nose normal.  Eyes:     Conjunctiva/sclera: Conjunctivae normal.     Pupils: Pupils are equal, round, and reactive to light.  Neck:     Musculoskeletal: Normal range of motion.     Trachea: Trachea normal. No tracheal deviation.  Cardiovascular:     Rate and Rhythm: Normal rate and regular rhythm.     Pulses: Normal pulses.     Heart sounds: Normal heart sounds.  Pulmonary:     Effort: Pulmonary effort is normal. No respiratory distress.     Breath sounds: Normal breath sounds.  Abdominal:     Palpations: Abdomen is soft.     Tenderness: There is no abdominal tenderness. There is no guarding or rebound.  Musculoskeletal: Normal range of motion.     Cervical back:  Normal. He exhibits normal range of motion, no tenderness, no bony tenderness and no deformity.     Thoracic back: Normal. He exhibits no tenderness, no bony tenderness and no deformity.     Comments: Cervical Spine: Appearance normal. No obvious bony deformity. No skin swelling, erythema, heat, fluctuance or break of the skin. No TTP over the cervical spinous processes. No paraspinal tenderness. No step-offs. Patient is able to actively rotate their neck 45 degrees left and right voluntarily without pain and flex and extend the neck without pain.  -------------- Left Shoulder: Appearance normal. No obvious bony deformity. No skin swelling, erythema, heat, fluctuance or break of the skin. No clavicular deformity or TTP. TTP over anterior deltoid muscle. Passive flexion, extension, abduction, adduction, and internal/external rotation with increase pain but without crepitus. Patient  with active range of motion in all directions however with decreased range of motion secondary to pain. Resistance intact to range of motion of shoulder in all directions. ------------------  Left Elbow: Appearance normal. No obvious bony deformity. No skin swelling, erythema, heat, fluctuance or break of the skin. No TTP over joint. Active flexion, extension, supination and pronation full and intact without pain. Strength able and appropriate for age for flexion and extension.   Radial Pulse 2+. Cap refill <2 seconds. SILT for M/U/R distributions. Compartments soft.  No color change of the skin of bilateral upper extremities.  All joints without obvious swelling, no increased warmth, no fluctuance or induration present.  No temperature difference between upper extremities.  Skin:    General: Skin is warm and dry.     Capillary Refill: Capillary refill takes less than 2 seconds.  Neurological:     Mental Status: He is alert.     GCS: GCS eye subscore is 4. GCS verbal subscore is 5. GCS motor subscore is 6.     Comments:  Speech is clear and goal oriented, follows commands Major Cranial nerves without deficit, no facial droop Normal strength in upper and lower extremities bilaterally including dorsiflexion and plantar flexion, strong and equal grip strength Sensation normal to light touch Moves extremities without ataxia, coordination intact Normal gait  Psychiatric:        Behavior: Behavior normal.    ED Treatments / Results  Labs (all labs ordered are listed, but only abnormal results are displayed) Labs Reviewed - No data to display  EKG None  Radiology Dg Shoulder Left  Result Date: 01/16/2019 CLINICAL DATA:  Patient reports that he woke up 2 days ago and his left shoulder was painful and had left arm swelling, denies any injury EXAM: LEFT SHOULDER - 2+ VIEW COMPARISON:  None. FINDINGS: No fracture.  No bone lesion. Glenohumeral and AC joints are normally spaced and aligned. Soft tissues are unremarkable. IMPRESSION: Negative. Electronically Signed   By: Amie Portland M.D.   On: 01/16/2019 12:14   Ue Venous Duplex ([facility] Only)  Result Date: 01/16/2019 UPPER VENOUS STUDY  Indications: Pain, and Swelling Limitations: Severe Pain, impossible to move upper extremity. Comparison Study: No comparison study available Performing Technologist: Melodie Bouillon  Examination Guidelines: A complete evaluation includes B-mode imaging, spectral Doppler, color Doppler, and power Doppler as needed of all accessible portions of each vessel. Bilateral testing is considered an integral part of a complete examination. Limited examinations for reoccurring indications may be performed as noted.  Right Findings: +----------+------------+----------+---------+-----------+-------+ RIGHT     CompressiblePropertiesPhasicitySpontaneousSummary +----------+------------+----------+---------+-----------+-------+ Subclavian    Full                 Yes       Yes             +----------+------------+----------+---------+-----------+-------+  Left Findings: +----------+------------+----------+---------+-----------+--------------+ LEFT      CompressiblePropertiesPhasicitySpontaneous   Summary     +----------+------------+----------+---------+-----------+--------------+ IJV           Full                 Yes       Yes                   +----------+------------+----------+---------+-----------+--------------+ Subclavian    Full                 Yes       Yes                   +----------+------------+----------+---------+-----------+--------------+  Axillary      Full                 Yes       Yes                   +----------+------------+----------+---------+-----------+--------------+ Brachial      Full                                                 +----------+------------+----------+---------+-----------+--------------+ Radial        Full                                                 +----------+------------+----------+---------+-----------+--------------+ Ulnar                                               Not visualized +----------+------------+----------+---------+-----------+--------------+ Cephalic      Full                           Yes                   +----------+------------+----------+---------+-----------+--------------+ Basilic       Full                                                 +----------+------------+----------+---------+-----------+--------------+  Summary:  Right: No evidence of thrombosis in the subclavian.  Left: No evidence of deep vein thrombosis in the upper extremity. No evidence of superficial vein thrombosis in the upper extremity. No evidence of thrombosis in the subclavian.  *See table(s) above for measurements and observations.    Preliminary     Procedures Procedures (including critical care time)  Medications Ordered in ED Medications  lidocaine (LIDODERM) 5 % 1 patch (1 patch  Transdermal Patch Applied 01/16/19 1345)  lidocaine (LIDODERM) 5 % 1 patch (has no administration in time range)  oxyCODONE-acetaminophen (PERCOCET/ROXICET) 5-325 MG per tablet 1 tablet (1 tablet Oral Given 01/16/19 1153)     Initial Impression / Assessment and Plan / ED Course  I have reviewed the triage vital signs and the nursing notes.  Pertinent labs & imaging results that were available during my care of the patient were reviewed by me and considered in my medical decision making (see chart for details).     48 year old male presenting for 3-day history of left shoulder pain after stretching and feeling a twinge in his anterior deltoid.  Muscle spasm is noted of the anterior deltoid.  Physical exam shows no instability, full resisted range of motion in all directions.  Pain control given and patient with increased range of motion.  DG left shoulder negative for acute findings  Suspect today that patient's left shoulder pain due to muscle spasm of the left deltoid however cannot rule out possible rotator cuff etiology in emergency department today.  Patient placed in sling and strongly encouraged to follow-up with orthopedics, referral given.  Patient  with narcotic medications at home for chronic back pain, I will give Lidoderm patch here, encourage rice as well as range of motion exercises to avoid frozen shoulder.  Patient encouraged to call orthopedics this week to schedule an appointment for next week.  Extremity neurovascularly intact; no signs of infection, septic joint, DVT, compartment syndrome.  Patient is afebrile, not tachycardic, not hypotensive, well-appearing and in no acute distress.  Do not suspect cardiopulmonary etiology of left shoulder pain, it is consistently reproducible on examination and patient is without chest pain or shortness of breath. ---------------------- On reevaluation at discharge patient is concerned for blood clot.  Attempted reassurance. Due to patient  concern will proceed with ultrasound of left upper extremity to evaluate for DVT.  Again there is no objective swelling or abnormality of the upper extremities. ------------------------ Preliminary DVT Study: Summary:  Right: No evidence of thrombosis in the subclavian.  Left: No evidence of deep vein thrombosis in the upper extremity. No evidence of superficial vein thrombosis in the upper extremity. No evidence of thrombosis in the subclavian. ------------------------- Patient reassessed, stating improved after Lidoderm outpatient today.  I have rediscussed plan of care with the patient who states understanding and is agreeable to discharge at this time.  Extremity reassessed, neurovascular intact.  At this time there does not appear to be any evidence of an acute emergency medical condition and the patient appears stable for discharge with appropriate outpatient follow up. Diagnosis was discussed with patient who verbalizes understanding of care plan and is agreeable to discharge. I have discussed return precautions with patient who verbalizes understanding of return precautions. Patient strongly encouraged to follow-up with their PCP and ortho. All questions answered.  Patient's case discussed with Dr. Juleen ChinaKohut who agrees with plan to discharge with follow-up.   Note: Portions of this report may have been transcribed using voice recognition software. Every effort was made to ensure accuracy; however, inadvertent computerized transcription errors may still be present. Final Clinical Impressions(s) / ED Diagnoses   Final diagnoses:  Acute pain of left shoulder    ED Discharge Orders         Ordered    lidocaine (LIDODERM) 5 %  Every 24 hours     01/16/19 1559           Elizabeth PalauMorelli, Kelwin Gibler A, PA-C 01/16/19 1617    Raeford RazorKohut, Stephen, MD 01/23/19 [id number]

## 2019-02-24 ENCOUNTER — Emergency Department (HOSPITAL_COMMUNITY): Payer: Self-pay

## 2019-02-24 ENCOUNTER — Emergency Department (HOSPITAL_COMMUNITY)
Admission: EM | Admit: 2019-02-24 | Discharge: 2019-02-24 | Disposition: A | Discharge status: Home / Self Care | Payer: Self-pay | Attending: Emergency Medicine | Admitting: Emergency Medicine

## 2019-02-24 ENCOUNTER — Encounter (HOSPITAL_COMMUNITY): Payer: Self-pay

## 2019-02-24 ENCOUNTER — Other Ambulatory Visit: Payer: Self-pay

## 2019-02-24 DIAGNOSIS — J40 Bronchitis, not specified as acute or chronic: Secondary | ICD-10-CM

## 2019-02-24 DIAGNOSIS — F17210 Nicotine dependence, cigarettes, uncomplicated: Secondary | ICD-10-CM | POA: Insufficient documentation

## 2019-02-24 DIAGNOSIS — Z79899 Other long term (current) drug therapy: Secondary | ICD-10-CM | POA: Insufficient documentation

## 2019-02-24 DIAGNOSIS — R0789 Other chest pain: Secondary | ICD-10-CM

## 2019-02-24 LAB — CBC WITH DIFFERENTIAL/PLATELET
Abs Immature Granulocytes: 0.01 10*3/uL (ref 0.00–0.07)
Basophils Absolute: 0.1 10*3/uL (ref 0.0–0.1)
Basophils Relative: 1 %
Eosinophils Absolute: 0.3 10*3/uL (ref 0.0–0.5)
Eosinophils Relative: 5 %
HCT: 43.8 % (ref 39.0–52.0)
Hemoglobin: 13.8 g/dL (ref 13.0–17.0)
Immature Granulocytes: 0 %
Lymphocytes Relative: 29 %
Lymphs Abs: 1.9 10*3/uL (ref 0.7–4.0)
MCH: 29.4 pg (ref 26.0–34.0)
MCHC: 31.5 g/dL (ref 30.0–36.0)
MCV: 93.2 fL (ref 80.0–100.0)
Monocytes Absolute: 0.6 10*3/uL (ref 0.1–1.0)
Monocytes Relative: 8 %
NEUTROS ABS: 3.8 10*3/uL (ref 1.7–7.7)
Neutrophils Relative %: 57 %
Platelets: 259 10*3/uL (ref 150–400)
RBC: 4.7 MIL/uL (ref 4.22–5.81)
RDW: 14.7 % (ref 11.5–15.5)
WBC: 6.6 10*3/uL (ref 4.0–10.5)
nRBC: 0 % (ref 0.0–0.2)

## 2019-02-24 LAB — BASIC METABOLIC PANEL
Anion gap: 10 (ref 5–15)
BUN: 12 mg/dL (ref 6–20)
CO2: 27 mmol/L (ref 22–32)
Calcium: 9.7 mg/dL (ref 8.9–10.3)
Chloride: 99 mmol/L (ref 98–111)
Creatinine, Ser: 0.93 mg/dL (ref 0.61–1.24)
GFR calc Af Amer: >60 mL/min (ref >60)
GFR calc non Af Amer: >60 mL/min (ref >60)
Glucose, Bld: 107 mg/dL — ABNORMAL HIGH (ref 70–99)
POTASSIUM: 3.7 mmol/L (ref 3.5–5.1)
Sodium: 136 mmol/L (ref 135–145)

## 2019-02-24 LAB — INFLUENZA PANEL BY PCR (TYPE A & B)
Influenza A By PCR: NEGATIVE
Influenza B By PCR: NEGATIVE

## 2019-02-24 MED ORDER — ALBUTEROL SULFATE (2.5 MG/3ML) 0.083% IN NEBU
5.0000 mg | INHALATION_SOLUTION | Freq: Once | RESPIRATORY_TRACT | Status: AC
  Administered 2019-02-24: 5 mg via RESPIRATORY_TRACT
  Filled 2019-02-24: qty 6

## 2019-02-24 MED ORDER — BENZONATATE 100 MG PO CAPS
200.0000 mg | ORAL_CAPSULE | Freq: Once | ORAL | Status: AC
  Administered 2019-02-24: 200 mg via ORAL
  Filled 2019-02-24: qty 2

## 2019-02-24 MED ORDER — ALBUTEROL SULFATE HFA 108 (90 BASE) MCG/ACT IN AERS
2.0000 | INHALATION_SPRAY | Freq: Once | RESPIRATORY_TRACT | Status: AC
  Administered 2019-02-24: 2 via RESPIRATORY_TRACT
  Filled 2019-02-24: qty 6.7

## 2019-02-24 MED ORDER — PREDNISONE 20 MG PO TABS
60.0000 mg | ORAL_TABLET | Freq: Once | ORAL | Status: AC
  Administered 2019-02-24: 60 mg via ORAL
  Filled 2019-02-24: qty 3

## 2019-02-24 MED ORDER — PREDNISONE 10 MG PO TABS: 50.0000 mg | ORAL_TABLET | Freq: Every day | ORAL | 0 refills | Status: AC

## 2019-02-24 MED ORDER — IOHEXOL 350 MG/ML SOLN
100.0000 mL | Freq: Once | INTRAVENOUS | Status: AC | PRN
  Administered 2019-02-24: 100 mL via INTRAVENOUS

## 2019-02-24 MED ORDER — IPRATROPIUM-ALBUTEROL 0.5-2.5 (3) MG/3ML IN SOLN
3.0000 mL | Freq: Once | RESPIRATORY_TRACT | Status: AC
  Administered 2019-02-24: 3 mL via RESPIRATORY_TRACT
  Filled 2019-02-24: qty 3

## 2019-02-24 MED ORDER — SODIUM CHLORIDE (PF) 0.9 % IJ SOLN
INTRAMUSCULAR | Status: AC
  Filled 2019-02-24: qty 50

## 2019-02-24 NOTE — ED Triage Notes (Signed)
Patient c/o right chest pain, expiratory wheezing , and SOB since last week.

## 2019-02-24 NOTE — ED Provider Notes (Signed)
[facility]-EMERGENCY DEPT Provider Note   CSN: [id number] Arrival date & time: 02/24/19  1230    History   Chief Complaint Chief Complaint  Patient presents with   Chest Pain   Shortness of Breath    HPI Sergio Boyd is a 48 y.o. male.     48 year old male, no significant medical history, daily smoker presents with complaint of cough, shortness of breath, wheezing.  Patient states his symptoms started last week, became worse today.  Patient denies fevers, chills, sweats, recent travel or exposure to persons with recent travel, no known sick contacts, recent extended travel.  Patient denies history of asthma or chronic lung disease.  Patient also reports right shoulder injury with right upper chest wall pain, worse with movement of his shoulder or palpation of his pectoral area. Patient was given a neb treatment prior to assessment, states his helped somewhat. Also reports reflux- described as feeling full all the time, denies diaphoresis, nausea, vomiting.      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   External hemorrhoid 04/17/2017   Internal hemorrhoids 04/17/2017   Chronic neck pain 04/17/2017   Headache syndrome 04/17/2017   Chronic low back pain 04/17/2017   Localized swelling of back 04/17/2017   H/O lumbosacral spine surgery 04/17/2017    Past Surgical History:  Procedure Laterality Date   INGUINAL HERNIA REPAIR Right    INGUINAL HERNIA REPAIR Right 11/23/2015   Procedure: RIGHT INGUINAL HERNIA REPAIR W/MESH;  Surgeon: Avel Peace, MD;  Location: [facility] OR;  Service: General;  Laterality:  Right;   INSERTION OF MESH Right 11/23/2015   Procedure: INSERTION OF MESH;  Surgeon: Avel Peace, MD;  Location: [facility] OR;  Service: General;  Laterality: Right;   KNEE SURGERY     right ACL 2012   LUMBAR LAMINECTOMY/DECOMPRESSION MICRODISCECTOMY N/A 04/20/2017   Procedure: Re Operative Right Lumbar five-Sacral one Laminectomy/Recurrent discectomy, Left Lumbar four-five Laminectomy for decompression, pseudomeningocele repair;  Surgeon: Ditty, Loura Halt, MD;  Location: [facility] OR;  Service: Neurosurgery;  Laterality: N/A;   LUMBAR SPINE SURGERY  02/2017   Dr. Bevely Palmer   LUMBAR WOUND DEBRIDEMENT N/A 04/24/2017   Procedure: Repair of Pseudomeningocele and Placement of Lumbar Drain;  Surgeon: Ditty, Loura Halt, MD;  Location: [facility] OR;  Service: Neurosurgery;  Laterality: N/A;   PLACEMENT OF LUMBAR DRAIN N/A 04/24/2017   Procedure: PLACEMENT OF LUMBAR DRAIN;  Surgeon: Ditty, Loura Halt, MD;  Location: [facility] OR;  Service: Neurosurgery;  Laterality: N/A;        Home Medications    Prior to Admission medications   Medication Sig Start Date End Date Taking? Authorizing Provider  guaiFENesin (MUCINEX) 600 MG 12 hr tablet Take 600 mg by mouth 2 (two) times daily.   Yes [provider]  lidocaine (LIDODERM) 5 % Place 1 patch onto the skin daily. Remove & Discard patch within 12 hours or as directed by MD 01/16/19  Yes Harlene Salts A, PA-C  ibuprofen (ADVIL,MOTRIN) 800 MG tablet Take 1 tablet (800 mg total) by mouth every 8 (eight) hours as needed for moderate pain.  Patient not taking: Reported on 02/24/2019 11/28/18   Tysinger, Kermit Balo, PA-C  oxyCODONE-acetaminophen (PERCOCET/ROXICET) 5-325 MG tablet Take 1 tablet by mouth every 6 (six) hours as needed for severe pain. Patient not taking: Reported on 02/24/2019 11/28/18   Tysinger, Kermit Balo, PA-C  predniSONE (DELTASONE) 10 MG tablet Take 5 tablets (50 mg total) by mouth daily for 5 days. 02/24/19 03/01/19  Jeannie Fend, PA-C     Family History History reviewed. No pertinent family history.  Social History Social History   Tobacco Use   Smoking status: Current Some Day Smoker    Packs/day: 0.50    Years: 10.00    Pack years: 5.00    Types: Cigarettes   Smokeless tobacco: Never Used  Substance Use Topics   Alcohol use: No   Drug use: No     Allergies   Chloraseptic sore throat [acetaminophen]   Review of Systems Review of Systems  Constitutional: Negative for chills, diaphoresis and fever.  HENT: Negative for congestion, sinus pressure, sinus pain, sneezing and sore throat.   Respiratory: Positive for cough, chest tightness, shortness of breath and wheezing.   Cardiovascular: Negative for chest pain.  Gastrointestinal: Negative for nausea and vomiting.  Musculoskeletal: Negative for arthralgias and myalgias.  Skin: Negative for rash and wound.  Allergic/Immunologic: Negative for immunocompromised state.  Neurological: Negative for dizziness and weakness.  Hematological: Negative for adenopathy. Does not bruise/bleed easily.  Psychiatric/Behavioral: Negative for confusion.  All other systems reviewed and are negative.    Physical Exam Updated Vital Signs BP (!) 169/85    Pulse 67    Temp 97.8 F (36.6 C) (Oral)    Resp (!) 21    Ht 5\' 10"  (1.778 m)    Wt 95.3 kg    SpO2 95%    BMI 30.13 kg/m   Physical Exam Vitals signs and nursing note reviewed.  Constitutional:      General: He is not in acute distress.    Appearance: He is well-developed. He is not diaphoretic.  HENT:     Head: Normocephalic and atraumatic.  Neck:     Musculoskeletal: Neck supple.  Cardiovascular:     Rate and Rhythm: Normal rate and regular rhythm.     Heart sounds: Normal heart sounds. No murmur.  Pulmonary:     Effort: Pulmonary effort is normal. Tachypnea present.     Breath sounds: Decreased breath sounds and wheezing present.  Chest:     Chest wall: Tenderness present.    Musculoskeletal:      Right lower leg: No edema.     Left lower leg: No edema.  Lymphadenopathy:     Cervical: No cervical adenopathy.  Skin:    General: Skin is warm and dry.     Findings: No erythema.  Neurological:     Mental Status: He is alert and oriented to person, place, and time.  Psychiatric:        Behavior: Behavior normal.      ED Treatments / Results  Labs (all labs ordered are listed, but only abnormal results are displayed) Labs Reviewed  BASIC METABOLIC PANEL - Abnormal; Notable for the following components:      Result Value   Glucose, Bld 107 (*)    All other components within normal limits  CBC WITH DIFFERENTIAL/PLATELET  INFLUENZA PANEL BY PCR (TYPE A & B)    EKG EKG Interpretation  Date/Time:  Monday February 24 2019 12:37:26 EDT Ventricular Rate:  74 PR Interval:  QRS Duration: 101 QT Interval:  400 QTC Calculation: 444 R Axis:   83 Text Interpretation:  Sinus rhythm ST-t wave abnormality No significant change since last tracing Abnormal ekg Confirmed by Gerhard Munch [phone]) on 02/24/2019 4:47:45 PM   Radiology Dg Chest 2 View  Result Date: 02/24/2019 CLINICAL DATA:  Right chest pain EXAM: CHEST - 2 VIEW COMPARISON:  CT chest 05/26/2015 FINDINGS: The heart size and mediastinal contours are within normal limits. Both lungs are clear. The visualized skeletal structures are unremarkable. IMPRESSION: No active cardiopulmonary disease. Electronically Signed   By: Elige Ko   On: 02/24/2019 16:01   Ct Angio Chest Pe W/cm &/or Wo Cm  Result Date: 02/24/2019 CLINICAL DATA:  Right chest pain, expiratory wheezing EXAM: CT ANGIOGRAPHY CHEST WITH CONTRAST TECHNIQUE: Multidetector CT imaging of the chest was performed using the standard protocol during bolus administration of intravenous contrast. Multiplanar CT image reconstructions and MIPs were obtained to evaluate the vascular anatomy. CONTRAST:  OMNIPAQUE IOHEXOL 350 MG/ML SOLN COMPARISON:  None. FINDINGS:  Cardiovascular: Satisfactory opacification of the pulmonary arteries to the segmental level. No evidence of pulmonary embolism. Normal heart size. No pericardial effusion. Mediastinum/Nodes: No enlarged mediastinal, hilar, or axillary lymph nodes. Thyroid gland, trachea, and esophagus demonstrate no significant findings. Lungs/Pleura: Lungs are clear. No pleural effusion or pneumothorax. Upper Abdomen: No acute abnormality. Musculoskeletal: No chest wall abnormality. No acute or significant osseous findings. Review of the MIP images confirms the above findings. IMPRESSION: 1. No evidence of pulmonary embolus. 2. No acute cardiopulmonary disease. Electronically Signed   By: Elige Ko   On: 02/24/2019 19:22    Procedures Procedures (including critical care time)  Medications Ordered in ED Medications  sodium chloride (PF) 0.9 % injection (has no administration in time range)  albuterol (PROVENTIL) (2.5 MG/3ML) 0.083% nebulizer solution 5 mg (5 mg Nebulization Given 02/24/19 1253)  ipratropium-albuterol (DUONEB) 0.5-2.5 (3) MG/3ML nebulizer solution 3 mL (3 mLs Nebulization Given 02/24/19 1459)  predniSONE (DELTASONE) tablet 60 mg (60 mg Oral Given 02/24/19 1702)  benzonatate (TESSALON) capsule 200 mg (200 mg Oral Given 02/24/19 1829)  iohexol (OMNIPAQUE) 350 MG/ML injection 100 mL (100 mLs Intravenous Contrast Given 02/24/19 1835)  albuterol (PROVENTIL HFA;VENTOLIN HFA) 108 (90 Base) MCG/ACT inhaler 2 puff (2 puffs Inhalation Given 02/24/19 2141)     Initial Impression / Assessment and Plan / ED Course  I have reviewed the triage vital signs and the nursing notes.  Pertinent labs & imaging results that were available during my care of the patient were reviewed by me and considered in my medical decision making (see chart for details).  Clinical Course as of Feb 24 2208  Mon Feb 24, 2019  385 48 year old male with complaint of cough, wheezing, shortness of breath.  Patient reports cough and  congestion for the past week now feeling short of breath and wheezing, no history of asthma or chronic lung disease, patient is 1/2 pack a day smoker.  On initial exam patient had just completed a neb treatment, diminished breath sounds with diffuse wheezing, O2 sat 92% on room air.  Patient was given prednisone and additional neb treatment, chest x-ray was unremarkable.  Patient had slight improvement after second neb however her O2 sats remained at 92% and patient appeared to be uncomfortable.  CT chest ordered to evaluate for PE and is unremarkable.  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  Patient was put on 2 L nasal cannula and later taken off of oxygen, able to maintain his room air sats, able to ambulate about the department while maintaining O2 sats and without becoming short of breath.  Patient discharged with albuterol inhaler, 5-day course of prednisone with plan to follow-up with PCP, return to ER precautions.   [LM]    Clinical Course User Index [LM] Jeannie Fend, PA-C   Final Clinical Impressions(s) / ED Diagnoses   Final diagnoses:  Chest wall pain  Bronchitis    ED Discharge Orders         Ordered    predniSONE (DELTASONE) 10 MG tablet  Daily     02/24/19 2122           Jeannie Fend, PA-C 02/24/19 2209    Gerhard Munch, MD 02/26/19 [phone]

## 2019-02-24 NOTE — ED Notes (Signed)
Pt ambulated to the restroom and back to the room

## 2019-02-24 NOTE — Discharge Instructions (Addendum)
Take prednisone daily as prescribed.  Use albuterol inhaler 1 to 2 puffs every 4-6 hours as needed for cough, wheezing, shortness of breath. Follow-up with your primary care provider this week.  Return to ER for worsening or concerning symptoms. Monitor your blood pressure, take readings to your primary care doctor at follow-up.

## 2020-05-17 ENCOUNTER — Ambulatory Visit: Payer: Self-pay

## 2020-05-17 ENCOUNTER — Encounter: Payer: Self-pay | Admitting: Physician Assistant

## 2020-05-17 ENCOUNTER — Ambulatory Visit (INDEPENDENT_AMBULATORY_CARE_PROVIDER_SITE_OTHER): Payer: 59 | Admitting: Physician Assistant

## 2020-05-17 ENCOUNTER — Institutional Professional Consult (permissible substitution): Payer: Self-pay | Admitting: Internal Medicine

## 2020-05-17 VITALS — Ht 70.0 in | Wt 220.0 lb

## 2020-05-17 DIAGNOSIS — M25512 Pain in left shoulder: Secondary | ICD-10-CM

## 2020-05-17 DIAGNOSIS — M542 Cervicalgia: Secondary | ICD-10-CM | POA: Diagnosis not present

## 2020-05-17 MED ORDER — METHYLPREDNISOLONE 4 MG PO TABS: ORAL_TABLET | ORAL | 0 refills | Status: DC

## 2020-05-17 MED ORDER — METHOCARBAMOL 500 MG PO TABS: 500.0000 mg | ORAL_TABLET | Freq: Every day | ORAL | 1 refills | Status: DC

## 2020-05-17 NOTE — Progress Notes (Signed)
Office Visit Note   Patient: Sergio Boyd           Date of Birth: 10/01/1971           MRN: [id number] Visit Date: 05/17/2020              Requested by: No referring provider defined for this encounter. PCP: Patient, No Pcp Per   Assessment & Plan: Visit Diagnoses:  1. Neck pain   2. Acute pain of left shoulder     Plan: We will start with conservative treatment consisting of Medrol Dosepak and Robaxin.  We will also send him to formal physical therapy for range of motion of his C-spine home exercise program and modalities.  He will follow up with [place] in 4 weeks to see what type of response he had to this treatment.  Questions were encouraged and answered.  He continues to have radicular symptoms down the left arm most likely will require MRI to further evaluate.  Follow-Up Instructions: Return in about 4 weeks (around 06/14/2020).   Orders:  Orders Placed This Encounter  Procedures  . XR Cervical Spine 2 or 3 views  . XR Shoulder Left   Meds ordered this encounter  Medications  . methylPREDNISolone (MEDROL) 4 MG tablet    Sig: Take as directed    Dispense:  21 tablet    Refill:  0  . methocarbamol (ROBAXIN) 500 MG tablet    Sig: Take 1 tablet (500 mg total) by mouth at bedtime.    Dispense:  30 tablet    Refill:  1      Procedures: No procedures performed   Clinical Data: No additional findings.   Subjective: Chief Complaint  Patient presents with  . Neck - Pain  . Left Shoulder - Pain    HPI Sergio Boyd is a 49 year old male comes in today with left shoulder pain and back pain.  Pain is radiates down his left arm and into his hand.  States he has constant pain.  He tried ibuprofen without any real relief.  Said prior neck pain with a work-up of this in 2017 with an MRI which showed mild degenerative changes at C4-C5 and C5-C6 resulting in mild left C5 and bilateral C6 foraminal narrowing.  He states that he has had no fevers ,chills, chest pain, orthopnea or night  sweats.  Positive for shortness of breath when leaning over he states he was told by his primary care physician that this was due to him being overweight. Review of Systems See HPI otherwise negative  Objective: Vital Signs: Ht 5\' 10"  (1.778 m)   Wt 220 lb (99.8 kg)   BMI 31.57 kg/m   Physical Exam Constitutional:      Appearance: He is not ill-appearing or diaphoretic.  Cardiovascular:     Pulses: Normal pulses.  Pulmonary:     Effort: Pulmonary effort is normal.  Neurological:     Mental Status: He is alert and oriented to person, place, and time.  Psychiatric:        Mood and Affect: Mood normal.     Ortho Exam Bilateral shoulders full overhead activity actively.  Out of 5 strength with external and internal rotation against resistance empty can test is negative bilaterally.  Liftoff test negative bilaterally.  Impingement testing negative bilaterally. Cervical spine good range of motion with discomfort with rotation of the left right and also with extension.  Tenderness over the cervical spinal column and the left scapular medial border.  Positive Spurling's.  5 out of 5 strength bilateral upper extremities against resistance.  Subjective decreased sensation throughout the left hand compared to the right to light touch.  Full motor bilateral hands.  Deep tendon reflexes are 2+ at the biceps brachial radialis and triceps.  Specialty Comments:  No specialty comments available.  Imaging: XR Cervical Spine 2 or 3 views  Result Date: 05/17/2020 Cervical spine 2 views: Shows significant endplate spurring at C5 4 through C6.  Loss of lordotic curvature.  Disc space overall well-maintained.  No acute fractures.  XR Shoulder Left  Result Date: 05/17/2020 3 views left shoulder: Shoulder is well located.  Glenohumeral joint is well-maintained.  Generative changes the AC joint.  No acute fractures.  No bony abnormalities otherwise.    PMFS History: Patient Active Problem List    Diagnosis Date Noted  . Radicular low back pain 11/28/2018  . Left leg pain 11/28/2018  . Paresthesia 11/28/2018  . 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 External hemorrhoid 04/17/2017  . Internal hemorrhoids 04/17/2017  . Chronic neck pain 04/17/2017  . Headache syndrome 04/17/2017  . Chronic low back pain 04/17/2017  . Localized swelling of back 04/17/2017  . H/O lumbosacral spine surgery 04/17/2017   Past Medical History:  Diagnosis Date  . Arthritis   . Chronic back pain    herniated disc  . GERD (gastroesophageal reflux disease)   . Headache   . History of migraine   . Joint pain     No family history on file.  Past Surgical History:  Procedure Laterality Date  . INGUINAL HERNIA REPAIR Right   . INGUINAL HERNIA REPAIR Right 11/23/2015   Procedure: RIGHT INGUINAL HERNIA REPAIR W/MESH;  Surgeon: Avel Peace, MD;  Location: [facility] OR;  Service: General;  Laterality: Right;  . INSERTION OF MESH Right 11/23/2015   Procedure: INSERTION OF MESH;  Surgeon: Avel Peace, MD;  Location[facility] OR;  Service: General;  Laterality: Right;  . KNEE SURGERY     right ACL 2012  . LUMBAR LAMINECTOMY/DECOMPRESSION MICRODISCECTOMY N/A 04/20/2017   Procedure: Re Operative Right Lumbar five-Sacral one Laminectomy/Recurrent discectomy, Left Lumbar four-five Laminectomy for decompression, pseudomeningocele repair;  Surgeon: Ditty, Loura Halt, MD;  Location: [facility] OR;  Service: Neurosurgery;  Laterality: N/A;  . LUMBAR SPINE SURGERY  02/2017   Dr. Bevely Palmer  . LUMBAR WOUND DEBRIDEMENT N/A 04/24/2017   Procedure: Repair of Pseudomeningocele and Placement of Lumbar Drain;  Surgeon: Ditty, Loura Halt, MD;  Location: [facility] OR;  Service: Neurosurgery;  Laterality: N/A;  . PLACEMENT OF LUMBAR DRAIN N/A 04/24/2017   Procedure: PLACEMENT OF LUMBAR DRAIN;  Surgeon: Ditty, Loura Halt, MD;  Location: [facility] OR;  Service: Neurosurgery;  Laterality: N/A;   Social History   Occupational History  . Not  on file  Tobacco Use  . Smoking status: Current Some Day Smoker    Packs/day: 0.50    Years: 10.00    Pack years: 5.00    Types: Cigarettes  . Smokeless tobacco: Never Used  Substance and Sexual Activity  . Alcohol use: No  . Drug use: No  . Sexual activity: Not on file

## 2020-06-16 ENCOUNTER — Ambulatory Visit: Payer: 59 | Admitting: Physician Assistant

## 2020-06-28 ENCOUNTER — Other Ambulatory Visit: Payer: Self-pay

## 2020-06-28 ENCOUNTER — Ambulatory Visit (INDEPENDENT_AMBULATORY_CARE_PROVIDER_SITE_OTHER): Payer: 59 | Admitting: Physician Assistant

## 2020-06-28 ENCOUNTER — Institutional Professional Consult (permissible substitution): Payer: Self-pay | Admitting: Internal Medicine

## 2020-06-28 ENCOUNTER — Encounter: Payer: Self-pay | Admitting: Physician Assistant

## 2020-06-28 DIAGNOSIS — M542 Cervicalgia: Secondary | ICD-10-CM

## 2020-06-28 NOTE — Progress Notes (Signed)
Office Visit Note   Patient: Sergio Boyd           Date of Birth: 07/27/1971           MRN: [id number] Visit Date: 06/28/2020              Requested by: No referring provider defined for this encounter. PCP: Patient, No Pcp Per   Assessment & Plan: Visit Diagnoses:  1. Neck pain     Plan: He will continue home exercise program as shown by therapy.  DC formal physical therapy at this point.  We will obtain an MRI of his cervical spine to rule out HNP as a source of his radicular symptoms down the left arm.  Having follow-up after the MRI to go over results discuss further treatment.  I think continue to take ibuprofen as needed for pain.  He can discontinue the Robaxin as this is not helping with his pain.  Questions were encouraged and answered at length.  Follow-Up Instructions: Return After MRI.   Orders:  No orders of the defined types were placed in this encounter.  No orders of the defined types were placed in this encounter.     Procedures: No procedures performed   Clinical Data: No additional findings.   Subjective: Chief Complaint  Patient presents with  . Neck - Follow-up    HPI Sergio Boyd returns today follow-up on his neck pain and radicular symptoms down the left arm.  He states the arm and neck are better.  However he continues to have neck pain which is dull occasionally sharp pain.  He is also having some numbness still down the left arm but not as frequent.  He has been to 3 or 4 physical therapy visits and states this helps some.  Feels Medrol the Dosepak did help some.  Does not feel the muscle relaxant has been helpful.  No known injury.  Review of Systems See HPI.  Objective: Vital Signs: There were no vitals taken for this visit.  Physical Exam Constitutional:      Appearance: He is not ill-appearing or diaphoretic.  Pulmonary:     Effort: Pulmonary effort is normal.  Neurological:     Mental Status: He is alert and oriented to person,  place, and time.  Psychiatric:        Mood and Affect: Mood normal.     Ortho Exam Upper extremities 5 out of 5 strength throughout the upper extremities.  Negative impingement testing bilateral shoulders.  Tenderness over the left scapula medial border region.  Subjective decreased sensation throughout the left hand compared to the right.  Full motor bilateral hands.  Cervical spine good range of motion.  Positive Spurling's. Specialty Comments:  No specialty comments available.  Imaging: No results found.   PMFS History: Patient Active Problem List   Diagnosis Date Noted  . Radicular low back pain 11/28/2018  . Left leg pain 11/28/2018  . Paresthesia 11/28/2018  . 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 External hemorrhoid 04/17/2017  . Internal hemorrhoids 04/17/2017  . Chronic neck pain 04/17/2017  . Headache syndrome 04/17/2017  . Chronic low back pain 04/17/2017  . Localized swelling of back 04/17/2017  . H/O lumbosacral spine surgery 04/17/2017   Past Medical History:  Diagnosis Date  . Arthritis   . Chronic back pain    herniated disc  . GERD (gastroesophageal reflux disease)   . Headache   . History of migraine   . Joint pain  History reviewed. No pertinent family history.  Past Surgical History:  Procedure Laterality Date  . INGUINAL HERNIA REPAIR Right   . INGUINAL HERNIA REPAIR Right 11/23/2015   Procedure: RIGHT INGUINAL HERNIA REPAIR W/MESH;  Surgeon: Avel Peace, MD;  Location: [facility] OR;  Service: General;  Laterality: Right;  . INSERTION OF MESH Right 11/23/2015   Procedure: INSERTION OF MESH;  Surgeon: Avel Peace, MD;  Location: [facility] OR;  Service: General;  Laterality: Right;  . KNEE SURGERY     right ACL 2012  . LUMBAR LAMINECTOMY/DECOMPRESSION MICRODISCECTOMY N/A 04/20/2017   Procedure: Re Operative Right Lumbar five-Sacral one Laminectomy/Recurrent discectomy, Left Lumbar four-five Laminectomy for decompression, pseudomeningocele  repair;  Surgeon: Ditty, Loura Halt, MD;  Location: [facility] OR;  Service: Neurosurgery;  Laterality: N/A;  . LUMBAR SPINE SURGERY  02/2017   Dr. Bevely Palmer  . LUMBAR WOUND DEBRIDEMENT N/A 04/24/2017   Procedure: Repair of Pseudomeningocele and Placement of Lumbar Drain;  Surgeon: Ditty, Loura Halt, MD;  Location: [facility] OR;  Service: Neurosurgery;  Laterality: N/A;  . PLACEMENT OF LUMBAR DRAIN N/A 04/24/2017   Procedure: PLACEMENT OF LUMBAR DRAIN;  Surgeon: Ditty, Loura Halt, MD;  Location: [facility] OR;  Service: Neurosurgery;  Laterality: N/A;   Social History   Occupational History  . Not on file  Tobacco Use  . Smoking status: Current Some Day Smoker    Packs/day: 0.50    Years: 10.00    Pack years: 5.00    Types: Cigarettes  . Smokeless tobacco: Never Used  Vaping Use  . Vaping Use: Never used  Substance and Sexual Activity  . Alcohol use: No  . Drug use: No  . Sexual activity: Not on file

## 2020-06-28 NOTE — Addendum Note (Signed)
Addended by: Mardene Celeste B on: 06/28/2020 01:44 PM   Modules accepted: Orders

## 2020-07-06 ENCOUNTER — Telehealth: Payer: Self-pay | Admitting: Orthopaedic Surgery

## 2020-07-06 NOTE — Telephone Encounter (Signed)
2 nd attempted call to contact patient to set appt for MRI Review.2 nd VM left. Will try later

## 2020-07-19 ENCOUNTER — Other Ambulatory Visit: Payer: Self-pay

## 2020-07-19 ENCOUNTER — Ambulatory Visit
Admission: RE | Admit: 2020-07-19 | Discharge: 2020-07-19 | Disposition: A | Discharge status: Home / Self Care | Payer: 59 | Source: Ambulatory Visit | Attending: Physician Assistant | Admitting: Physician Assistant

## 2020-07-19 DIAGNOSIS — M542 Cervicalgia: Secondary | ICD-10-CM

## 2020-07-26 ENCOUNTER — Ambulatory Visit (INDEPENDENT_AMBULATORY_CARE_PROVIDER_SITE_OTHER): Payer: 59 | Admitting: Physician Assistant

## 2020-07-26 ENCOUNTER — Encounter: Payer: Self-pay | Admitting: Physician Assistant

## 2020-07-26 ENCOUNTER — Encounter: Payer: Self-pay | Admitting: Radiology

## 2020-07-26 ENCOUNTER — Other Ambulatory Visit: Payer: Self-pay | Admitting: Radiology

## 2020-07-26 DIAGNOSIS — M542 Cervicalgia: Secondary | ICD-10-CM | POA: Diagnosis not present

## 2020-07-26 DIAGNOSIS — M4722 Other spondylosis with radiculopathy, cervical region: Secondary | ICD-10-CM

## 2020-07-26 NOTE — Progress Notes (Signed)
HPI: Sergio Boyd returns today to go over the MRI of his cervical spine.  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  He has been to physical therapy which seems to help somewhat with his neck pain and the radicular symptoms down the left arm.  He is asking for note to refrain from driving for limited greater than 8 hours/day as he at times he does drive up to 12 hours today. MRI results cervical spine dated 07/19/2020 are reviewed with the patient.  MRI showed C3-C4 with moderate left foraminal narrowing otherwise no central canal stenosis and no foraminal stenosis on the right.  C4-5 C5 multifactorial severe bilateral foraminal stenosis no central canal stenosis.  C5-C6 moderate bilateral foraminal stenosis no central canal stenosis.  C6-C7 moderate left foraminal narrowing mild right foraminal narrowing.  No central canal stenosis.  C7-T1 mild right and moderate left foraminal stenosis.  No central canal stenosis.   Impression: Cervical spine spondylosis with radicular symptoms left arm  Plan: At this point time he is given a note for no driving or lift greater than 8 hours daily.  He will continue his exercises as taught by therapy for cervical spine.  We will also send him for he has of the cervical spine.  Like to see him back in 4 weeks after the ESI's to check his progress.  Questions were encouraged and answered.

## 2020-08-03 ENCOUNTER — Telehealth: Payer: Self-pay | Admitting: Physical Medicine and Rehabilitation

## 2020-08-03 NOTE — Telephone Encounter (Signed)
Documented in referral  

## 2020-08-03 NOTE — Telephone Encounter (Signed)
Patient called. He would like a returned call from [place].

## 2020-08-04 ENCOUNTER — Telehealth: Payer: Self-pay

## 2020-08-04 NOTE — Telephone Encounter (Signed)
Patient called  Patient was returning call back from our office  Call [phone]

## 2020-08-04 NOTE — Telephone Encounter (Signed)
Documented in referral  

## 2020-08-09 ENCOUNTER — Telehealth: Payer: Self-pay | Admitting: Physical Medicine and Rehabilitation

## 2020-08-09 NOTE — Telephone Encounter (Signed)
Patient called requesting for a call back from [place]. Patient did not disclose nature of call. Patient phone number is [phone].

## 2020-08-23 ENCOUNTER — Other Ambulatory Visit: Payer: Self-pay

## 2020-08-23 ENCOUNTER — Ambulatory Visit: Payer: Self-pay

## 2020-08-23 ENCOUNTER — Encounter: Payer: Self-pay | Admitting: Physical Medicine and Rehabilitation

## 2020-08-23 ENCOUNTER — Ambulatory Visit (INDEPENDENT_AMBULATORY_CARE_PROVIDER_SITE_OTHER): Payer: 59 | Admitting: Physical Medicine and Rehabilitation

## 2020-08-23 VITALS — BP 121/81 | HR 69

## 2020-08-23 DIAGNOSIS — M5412 Radiculopathy, cervical region: Secondary | ICD-10-CM

## 2020-08-23 DIAGNOSIS — M4802 Spinal stenosis, cervical region: Secondary | ICD-10-CM

## 2020-08-23 DIAGNOSIS — M9971 Connective tissue and disc stenosis of intervertebral foramina of cervical region: Secondary | ICD-10-CM

## 2020-08-23 MED ORDER — METHYLPREDNISOLONE ACETATE 80 MG/ML IJ SUSP
80.0000 mg | Freq: Once | INTRAMUSCULAR | Status: AC
  Administered 2020-08-23: 80 mg

## 2020-08-23 NOTE — Progress Notes (Signed)
Pt state neck and lower back pain. Pt state moving around makes everything hurts. Pt state strengthen helps a little with pain.   Numeric Pain Rating Scale and Functional Assessment Average Pain 8   In the last MONTH (on 0-10 scale) has pain interfered with the following?  1. General activity like being  able to carry out your everyday physical activities such as walking, climbing stairs, carrying groceries, or moving a chair?  Rating(10)   +Driver, -BT, -Dye Allergies.

## 2020-08-23 NOTE — Progress Notes (Addendum)
Sergio BeaversMiguel Boyd - 49 y.o. male MRN [id number]  Date of birth: 03-11-1971  Office Visit Note: Visit Date: 08/23/2020 PCP: Patient, No Pcp Per Referred by: Kirtland Bouchardlark, Gilbert W, PA-C  Subjective: Chief Complaint  Patient presents with  . Neck - Pain  . Lower Back - Pain   HPI: Sergio Boyd is a 49 y.o. male who comes in today at the request of Rexene EdisonGil Clark, PA-C for planned Left C7-T1 Cervical epidural steroid injection with fluoroscopic guidance.  The patient has failed conservative care including home exercise, medications, time and activity modification.  This injection will be diagnostic and hopefully therapeutic.  Please see requesting physician notes for further details and justification.  MRI reviewed with images and spine model.  MRI reviewed in the note below.    ROS Otherwise per HPI.  Assessment & Plan: Visit Diagnoses:  1. Cervical radiculopathy   2. Foraminal stenosis of cervical region     Plan: No additional findings.   Meds & Orders:  Meds ordered this encounter  Medications  . methylPREDNISolone acetate (DEPO-MEDROL) injection 80 mg    Orders Placed This Encounter  Procedures  . XR C-ARM NO REPORT  . Epidural Steroid injection    Follow-up: Return if symptoms worsen or fail to improve.   Procedures: No procedures performed  Cervical Epidural Steroid Injection - Interlaminar Approach with Fluoroscopic Guidance  Patient: Sergio Boyd      Date of Birth: 03-11-1971 MRN: [id number] PCP: Patient, No Pcp Per      Visit Date: 08/23/2020   Universal Protocol:    Date/Time: 09/13/219:56 AM  Consent Given By: the patient  Position: PRONE  Additional Comments: Vital signs were monitored before and after the procedure. Patient was prepped and draped in the usual sterile fashion. The correct patient, procedure, and site was verified.   Injection Procedure Details:  Procedure Site One Meds Administered:  Meds ordered this encounter  Medications  .  methylPREDNISolone acetate (DEPO-MEDROL) injection 80 mg     Laterality: Left  Location/Site: C7-T1  Needle size: 20 G  Needle type: Touhy  Needle Placement: Paramedian epidural space  Findings:  -Comments: Excellent flow of contrast into the epidural space.  Procedure Details: Using a paramedian approach from the side mentioned above, the region overlying the inferior lamina was localized under fluoroscopic visualization and the soft tissues overlying this structure were infiltrated with 4 ml. of 1% Lidocaine without Epinephrine. A # 20 gauge, Tuohy needle was inserted into the epidural space using a paramedian approach.  The epidural space was localized using loss of resistance along with lateral and contralateral oblique bi-planar fluoroscopic views.  After negative aspirate for air, blood, and CSF, a 2 ml. volume of Isovue-250 was injected into the epidural space and the flow of contrast was observed. Radiographs were obtained for documentation purposes.   The injectate was administered into the level noted above.  Additional Comments:  The patient tolerated the procedure well Dressing: 2 x 2 sterile gauze and Band-Aid    Post-procedure details: Patient was observed during the procedure. Post-procedure instructions were reviewed.  Patient left the clinic in stable condition.     Clinical History: Chronic neck pain radiating into the left arm  EXAM: MRI CERVICAL SPINE WITHOUT CONTRAST  TECHNIQUE: Multiplanar, multisequence MR imaging of the cervical spine was performed. No intravenous contrast was administered.  COMPARISON:  05/03/2017  FINDINGS: Alignment: Physiologic.  Vertebrae: No fracture, evidence of discitis, or bone lesion.  Cord: Normal signal and morphology.  Posterior  Fossa, vertebral arteries, paraspinal tissues: Posterior fossa demonstrates no focal abnormality. Vertebral artery flow voids are maintained. Paraspinal soft tissues are  unremarkable.  Disc levels:  Discs: Degenerative disc disease with disc height loss at C4-5 and C5-6.  C2-3: No significant disc bulge. No neural foraminal stenosis. No central canal stenosis.  C3-4: No significant disc bulge. Mild left facet arthropathy. Moderate left foraminal narrowing. No right foraminal stenosis. No central canal stenosis.  C4-5: Broad-based disc bulge. Bilateral uncovertebral degenerative changes. Severe bilateral foraminal stenosis. No central canal stenosis.  C5-6: Broad-based disc bulge. Bilateral uncovertebral degenerative changes. Moderate bilateral foraminal stenosis. No central canal stenosis.  C6-7: Mild broad-based disc bulge. Left uncovertebral degenerative changes. Moderate left foraminal narrowing. Mild right foraminal narrowing. No central canal stenosis.  C7-T1: Mild broad-based disc bulge. Moderate bilateral facet arthropathy. Mild right and moderate left foraminal narrowing. No central canal stenosis.  IMPRESSION: 1. Cervical spine spondylosis as described above. 2. No acute osseous injury of the cervical spine.   Electronically Signed   By: Elige Ko   On: 07/19/2020 14:58   He reports that he has been smoking cigarettes. He has a 5.00 pack-year smoking history. He has never used smokeless tobacco. No results for input(s): HGBA1C, LABURIC in the last 8760 hours.  Objective:  VS:  HT:    WT:   BMI:     BP:121/81  HR:69bpm  TEMP: ( )  RESP:  Physical Exam Vitals and nursing note reviewed.  Constitutional:      General: He is not in acute distress.    Appearance: Normal appearance. He is not ill-appearing.  HENT:     Head: Normocephalic and atraumatic.     Right Ear: External ear normal.     Left Ear: External ear normal.  Eyes:     Extraocular Movements: Extraocular movements intact.  Cardiovascular:     Rate and Rhythm: Normal rate.     Pulses: Normal pulses.  Abdominal:     General: There is no  distension.     Palpations: Abdomen is soft.  Musculoskeletal:        General: No signs of injury.     Cervical back: Neck supple. Tenderness present. No rigidity.     Right lower leg: No edema.     Left lower leg: No edema.     Comments: Patient has good strength in the upper extremities with 5 out of 5 strength in wrist extension long finger flexion APB.  No intrinsic hand muscle atrophy.  Negative Hoffmann's test.  Lymphadenopathy:     Cervical: No cervical adenopathy.  Skin:    Findings: No erythema or rash.  Neurological:     General: No focal deficit present.     Mental Status: He is alert and oriented to person, place, and time.     Sensory: No sensory deficit.     Motor: No weakness or abnormal muscle tone.     Coordination: Coordination normal.  Psychiatric:        Mood and Affect: Mood normal.        Behavior: Behavior normal.     Ortho Exam  Imaging: Epidural Steroid injection  Result Date: 08/23/2020 Tyrell Antonio, MD     08/23/2020  9:57 AM Cervical Epidural Steroid Injection - Interlaminar Approach with Fluoroscopic Guidance Patient: Sergio Boyd     Date of Birth: [birth year] MRN: [id number] PCP: Patient, No Pcp Per     Visit Date: 08/23/2020  Universal Protocol:   Date/Time: 09/13/219:56 AM  Consent Given By: the patient Position: PRONE Additional Comments: Vital signs were monitored before and after the procedure. Patient was prepped and draped in the usual sterile fashion. The correct patient, procedure, and site was verified. Injection Procedure Details: Procedure Site One Meds Administered: Meds ordered this encounter Medications . methylPREDNISolone acetate (DEPO-MEDROL) injection 80 mg  Laterality: Left Location/Site: C7-T1 Needle size: 20 G Needle type: Touhy Needle Placement: Paramedian epidural space Findings:  -Comments: Excellent flow of contrast into the epidural space. Procedure Details: Using a paramedian approach from the side mentioned above, the region  overlying the inferior lamina was localized under fluoroscopic visualization and the soft tissues overlying this structure were infiltrated with 4 ml. of 1% Lidocaine without Epinephrine. A # 20 gauge, Tuohy needle was inserted into the epidural space using a paramedian approach. The epidural space was localized using loss of resistance along with lateral and contralateral oblique bi-planar fluoroscopic views.  After negative aspirate for air, blood, and CSF, a 2 ml. volume of Isovue-250 was injected into the epidural space and the flow of contrast was observed. Radiographs were obtained for documentation purposes. The injectate was administered into the level noted above. Additional Comments: The patient tolerated the procedure well Dressing: 2 x 2 sterile gauze and Band-Aid  Post-procedure details: Patient was observed during the procedure. Post-procedure instructions were reviewed. Patient left the clinic in stable condition.    Past Medical/Family/Surgical/Social History: Medications & Allergies reviewed per EMR, new medications updated. Patient Active Problem List   Diagnosis Date Noted  . Radicular low back pain 11/28/2018  . Left leg pain 11/28/2018  . Paresthesia 11/28/2018  . 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 External hemorrhoid 04/17/2017  . Internal hemorrhoids 04/17/2017  . Chronic neck pain 04/17/2017  . Headache syndrome 04/17/2017  . Chronic low back pain 04/17/2017  . Localized swelling of back 04/17/2017  . H/O lumbosacral spine surgery 04/17/2017   Past Medical History:  Diagnosis Date  . Arthritis   . Chronic back pain    herniated disc  . GERD (gastroesophageal reflux disease)   . Headache   . History of migraine   . Joint pain    History reviewed. No pertinent family history. Past Surgical History:  Procedure Laterality Date  . INGUINAL HERNIA REPAIR Right   . INGUINAL HERNIA REPAIR Right 11/23/2015   Procedure: RIGHT INGUINAL HERNIA REPAIR W/MESH;   Surgeon: Avel Peace, MD;  Location: [facility] OR;  Service: General;  Laterality: Right;  . INSERTION OF MESH Right 11/23/2015   Procedure: INSERTION OF MESH;  Surgeon: Avel Peace, MD;  Location: [facility] OR;  Service: General;  Laterality: Right;  . KNEE SURGERY     right ACL 2012  . LUMBAR LAMINECTOMY/DECOMPRESSION MICRODISCECTOMY N/A 04/20/2017   Procedure: Re Operative Right Lumbar five-Sacral one Laminectomy/Recurrent discectomy, Left Lumbar four-five Laminectomy for decompression, pseudomeningocele repair;  Surgeon: Ditty, Loura Halt, MD;  Location: [facility] OR;  Service: Neurosurgery;  Laterality: N/A;  . LUMBAR SPINE SURGERY  02/2017   Dr. Bevely Palmer  . LUMBAR WOUND DEBRIDEMENT N/A 04/24/2017   Procedure: Repair of Pseudomeningocele and Placement of Lumbar Drain;  Surgeon: Ditty, Loura Halt, MD;  Location: [facility] OR;  Service: Neurosurgery;  Laterality: N/A;  . PLACEMENT OF LUMBAR DRAIN N/A 04/24/2017   Procedure: PLACEMENT OF LUMBAR DRAIN;  Surgeon: Ditty, Loura Halt, MD;  Location: [facility] OR;  Service: Neurosurgery;  Laterality: N/A;   Social History   Occupational History  . Not on file  Tobacco Use  . Smoking status: Current  Some Day Smoker    Packs/day: 0.50    Years: 10.00    Pack years: 5.00    Types: Cigarettes  . Smokeless tobacco: Never Used  Vaping Use  . Vaping Use: Never used  Substance and Sexual Activity  . Alcohol use: No  . Drug use: No  . Sexual activity: Not on file

## 2020-08-23 NOTE — Procedures (Signed)
Cervical Epidural Steroid Injection - Interlaminar Approach with Fluoroscopic Guidance  Patient: Sergio Boyd      Date of Birth: 09-19-1971 MRN: [id number] PCP: Patient, No Pcp Per      Visit Date: 08/23/2020   Universal Protocol:    Date/Time: 09/13/219:56 AM  Consent Given By: the patient  Position: PRONE  Additional Comments: Vital signs were monitored before and after the procedure. Patient was prepped and draped in the usual sterile fashion. The correct patient, procedure, and site was verified.   Injection Procedure Details:  Procedure Site One Meds Administered:  Meds ordered this encounter  Medications  . methylPREDNISolone acetate (DEPO-MEDROL) injection 80 mg     Laterality: Left  Location/Site: C7-T1  Needle size: 20 G  Needle type: Touhy  Needle Placement: Paramedian epidural space  Findings:  -Comments: Excellent flow of contrast into the epidural space.  Procedure Details: Using a paramedian approach from the side mentioned above, the region overlying the inferior lamina was localized under fluoroscopic visualization and the soft tissues overlying this structure were infiltrated with 4 ml. of 1% Lidocaine without Epinephrine. A # 20 gauge, Tuohy needle was inserted into the epidural space using a paramedian approach.  The epidural space was localized using loss of resistance along with lateral and contralateral oblique bi-planar fluoroscopic views.  After negative aspirate for air, blood, and CSF, a 2 ml. volume of Isovue-250 was injected into the epidural space and the flow of contrast was observed. Radiographs were obtained for documentation purposes.   The injectate was administered into the level noted above.  Additional Comments:  The patient tolerated the procedure well Dressing: 2 x 2 sterile gauze and Band-Aid    Post-procedure details: Patient was observed during the procedure. Post-procedure instructions were reviewed.  Patient left the  clinic in stable condition.

## 2021-03-14 ENCOUNTER — Other Ambulatory Visit: Payer: Self-pay | Admitting: Internal Medicine

## 2021-03-14 ENCOUNTER — Ambulatory Visit
Admission: RE | Admit: 2021-03-14 | Discharge: 2021-03-14 | Disposition: A | Discharge status: Home / Self Care | Payer: 59 | Source: Ambulatory Visit | Attending: Internal Medicine | Admitting: Internal Medicine

## 2021-03-14 DIAGNOSIS — M25522 Pain in left elbow: Secondary | ICD-10-CM

## 2021-12-28 ENCOUNTER — Emergency Department (HOSPITAL_BASED_OUTPATIENT_CLINIC_OR_DEPARTMENT_OTHER)
Admission: EM | Admit: 2021-12-28 | Discharge: 2021-12-28 | Disposition: A | Discharge status: Home / Self Care | Payer: 59 | Attending: Emergency Medicine | Admitting: Emergency Medicine

## 2021-12-28 ENCOUNTER — Encounter (HOSPITAL_BASED_OUTPATIENT_CLINIC_OR_DEPARTMENT_OTHER): Payer: Self-pay | Admitting: Emergency Medicine

## 2021-12-28 ENCOUNTER — Other Ambulatory Visit: Payer: Self-pay

## 2021-12-28 ENCOUNTER — Emergency Department (HOSPITAL_BASED_OUTPATIENT_CLINIC_OR_DEPARTMENT_OTHER): Payer: 59

## 2021-12-28 DIAGNOSIS — K6289 Other specified diseases of anus and rectum: Secondary | ICD-10-CM | POA: Diagnosis present

## 2021-12-28 DIAGNOSIS — K610 Anal abscess: Secondary | ICD-10-CM | POA: Diagnosis not present

## 2021-12-28 LAB — BASIC METABOLIC PANEL
Anion gap: 10 (ref 5–15)
BUN: 9 mg/dL (ref 6–20)
CO2: 25 mmol/L (ref 22–32)
Calcium: 9.3 mg/dL (ref 8.9–10.3)
Chloride: 105 mmol/L (ref 98–111)
Creatinine, Ser: 0.88 mg/dL (ref 0.61–1.24)
GFR, Estimated: >60 mL/min (ref >60)
Glucose, Bld: 95 mg/dL (ref 70–99)
Potassium: 3.1 mmol/L — ABNORMAL LOW (ref 3.5–5.1)
Sodium: 140 mmol/L (ref 135–145)

## 2021-12-28 LAB — CBC WITH DIFFERENTIAL/PLATELET
Abs Immature Granulocytes: 0.01 10*3/uL (ref 0.00–0.07)
Basophils Absolute: 0 10*3/uL (ref 0.0–0.1)
Basophils Relative: 0 %
Eosinophils Absolute: 0 10*3/uL (ref 0.0–0.5)
Eosinophils Relative: 1 %
HCT: 35.3 % — ABNORMAL LOW (ref 39.0–52.0)
Hemoglobin: 11.9 g/dL — ABNORMAL LOW (ref 13.0–17.0)
Immature Granulocytes: 0 %
Lymphocytes Relative: 26 %
Lymphs Abs: 1.9 10*3/uL (ref 0.7–4.0)
MCH: 30.4 pg (ref 26.0–34.0)
MCHC: 33.7 g/dL (ref 30.0–36.0)
MCV: 90.1 fL (ref 80.0–100.0)
Monocytes Absolute: 0.7 10*3/uL (ref 0.1–1.0)
Monocytes Relative: 9 %
Neutro Abs: 4.7 10*3/uL (ref 1.7–7.7)
Neutrophils Relative %: 64 %
Platelets: 234 10*3/uL (ref 150–400)
RBC: 3.92 MIL/uL — ABNORMAL LOW (ref 4.22–5.81)
RDW: 15.9 % — ABNORMAL HIGH (ref 11.5–15.5)
WBC: 7.3 10*3/uL (ref 4.0–10.5)
nRBC: 0 % (ref 0.0–0.2)

## 2021-12-28 MED ORDER — AMOXICILLIN-POT CLAVULANATE 500-125 MG PO TABS: 1.0000 | ORAL_TABLET | Freq: Three times a day (TID) | ORAL | 0 refills | Status: DC

## 2021-12-28 MED ORDER — LIDOCAINE HCL (PF) 1 % IJ SOLN
5.0000 mL | Freq: Once | INTRAMUSCULAR | Status: AC
  Administered 2021-12-28: 5 mL via INTRADERMAL
  Filled 2021-12-28: qty 5

## 2021-12-28 MED ORDER — IOHEXOL 300 MG/ML  SOLN
100.0000 mL | Freq: Once | INTRAMUSCULAR | Status: AC | PRN
  Administered 2021-12-28: 100 mL via INTRAVENOUS

## 2021-12-28 MED ORDER — OXYCODONE HCL 5 MG PO TABS: 5.0000 mg | ORAL_TABLET | ORAL | 0 refills | Status: DC | PRN

## 2021-12-28 MED ORDER — HYDROMORPHONE HCL 1 MG/ML IJ SOLN
1.0000 mg | Freq: Once | INTRAMUSCULAR | Status: AC
Start: 2021-12-28 — End: 2021-12-28
  Administered 2021-12-28: 1 mg via INTRAVENOUS
  Filled 2021-12-28: qty 1

## 2021-12-28 MED ORDER — AMOXICILLIN-POT CLAVULANATE 875-125 MG PO TABS
1.0000 | ORAL_TABLET | Freq: Once | ORAL | Status: AC
  Administered 2021-12-28: 1 via ORAL
  Filled 2021-12-28: qty 1

## 2021-12-28 NOTE — Discharge Instructions (Signed)
Begin taking Augmentin as prescribed.  Begin taking oxycodone as prescribed as needed for pain.  Apply warm compresses or perform sits baths as frequently as possible for the next several days.  Return to the emergency department for worsening pain, high fever, or other new and concerning symptoms.

## 2021-12-28 NOTE — ED Provider Notes (Signed)
[facility] EMERGENCY DEPT Provider Note   CSN: [id number] Arrival date & time: 12/28/21  0149     History  Chief Complaint  Patient presents with   Rectal Pain    Sergio Boyd is a 51 y.o. male.  Patient is a 51 year old male with past medical history of prior back surgery.  Patient presenting today with complaints of rectal pain.  This is worsened over the past 3 days.  He denies any injury or trauma.  He denies any bloody stool or bowel movements.  He has put Preparation H in the area, however this does not seem to be helping.  He denies any fevers or chills.  Pain is worse with sitting and movement.  There are no alleviating factors.  The history is provided by the patient.      Home Medications Prior to Admission medications   Medication Sig Start Date End Date Taking? Authorizing Provider  guaiFENesin (MUCINEX) 600 MG 12 hr tablet Take 600 mg by mouth 2 (two) times daily.    [provider]  ibuprofen (ADVIL,MOTRIN) 800 MG tablet Take 1 tablet (800 mg total) by mouth every 8 (eight) hours as needed for moderate pain. 11/28/18   Tysinger, Camelia Eng, PA-C  lidocaine (LIDODERM) 5 % Place 1 patch onto the skin daily. Remove & Discard patch within 12 hours or as directed by MD 01/16/19   Deliah Boston, PA-C  methocarbamol (ROBAXIN) 500 MG tablet Take 1 tablet (500 mg total) by mouth at bedtime. 05/17/20   Pete Pelt, PA-C  methylPREDNISolone (MEDROL) 4 MG tablet Take as directed 05/17/20   Pete Pelt, PA-C      Allergies    Chloraseptic sore throat [acetaminophen]    Review of Systems   Review of Systems  All other systems reviewed and are negative.  Physical Exam Updated Vital Signs BP 124/71    Pulse 60    Temp 98 F (36.7 C) (Oral)    Resp 18    Wt 95.3 kg    SpO2 98%    BMI 30.13 kg/m  Physical Exam Vitals and nursing note reviewed.  Constitutional:      General: He is not in acute distress.    Appearance: He is well-developed. He is  not diaphoretic.  HENT:     Head: Normocephalic and atraumatic.  Cardiovascular:     Rate and Rhythm: Normal rate and regular rhythm.     Heart sounds: No murmur heard.   No friction rub.  Pulmonary:     Effort: Pulmonary effort is normal. No respiratory distress.     Breath sounds: Normal breath sounds. No wheezing or rales.  Abdominal:     General: Bowel sounds are normal. There is no distension.     Palpations: Abdomen is soft.     Tenderness: There is no abdominal tenderness.  Genitourinary:    Comments: There is a tender, swollen area adjacent to the anus.  He has pain with digital rectal exam, but there is no bleeding or obvious abnormality. Musculoskeletal:        General: Normal range of motion.     Cervical back: Normal range of motion and neck supple.  Skin:    General: Skin is warm and dry.  Neurological:     Mental Status: He is alert and oriented to person, place, and time.     Coordination: Coordination normal.    ED Results / Procedures / Treatments   Labs (all labs ordered are  listed, but only abnormal results are displayed) Labs Reviewed  BASIC METABOLIC PANEL  CBC WITH DIFFERENTIAL/PLATELET    EKG None  Radiology No results found.  Procedures Procedures  INCISION AND DRAINAGE Performed by: Veryl Speak Consent: Verbal consent obtained. Risks and benefits: risks, benefits and alternatives were discussed Type: abscess  Body area: Perianal  Anesthesia: local infiltration  Incision was made with a scalpel.  Local anesthetic: lidocaine 1% without epinephrine  Anesthetic total: 2 ml  Complexity: complex Blunt dissection to break up loculations  Drainage: purulent  Drainage amount: Moderate  Packing material: No packing placed  Patient tolerance: Patient tolerated the procedure well with no immediate complications.    Medications Ordered in ED Medications - No data to display  ED Course/ Medical Decision Making/ A&P  This patient  presents to the ED for concern of rectal pain, this involves an extensive number of treatment options, and is a complaint that carries with it a high risk of complications and morbidity.  The differential diagnosis includes hemorrhoid versus perirectal versus perianal abscess   Co morbidities that complicate the patient evaluation  None   Additional history obtained:  No additional history or external records required   Lab Tests:  I Ordered, and personally interpreted labs.  The pertinent results include: Unremarkable CBC and basic metabolic panel   Imaging Studies ordered:  I ordered imaging studies including CT scan of the pelvis I independently visualized and interpreted imaging which showed a 2.2 cm perianal abscess, but no intraperitoneal extension I agree with the radiologist interpretation   Cardiac Monitoring:  No cardiac monitoring performed  Medicines ordered and prescription drug management:  I ordered medication including Dilaudid for pain and lidocaine for anesthesia Reevaluation of the patient after these medicines showed that the patient improved I have reviewed the patients home medicines and have made adjustments as needed   Test Considered:  No other test considered   Critical Interventions:  Incision and drainage as above   Consultations Obtained:  I requested consultation with the general surgeon, Dr. Redmond Pulling,  and discussed lab and imaging findings as well as pertinent plan - they recommend: Bedside incision and drainage   Problem List / ED Course:  Patient presenting with pain and swelling near the rectum.  CT scan and exam consistent with perianal abscess with no intra peritoneal extension.  This was incised and drained as below.  Patient will be discharged to home with sitz bath's, Augmentin, pain medication, and follow-up as needed   Reevaluation:  After the interventions noted above, I reevaluated the patient and found that they have  :improved   Social Determinants of Health:  None   Dispostion:  After consideration of the diagnostic results and the patients response to treatment, I feel that the patent would benefit from oral antibiotics, sitz bath's, and return as needed..    Final Clinical Impression(s) / ED Diagnoses Final diagnoses:  None    Rx / DC Orders ED Discharge Orders     None         Veryl Speak, MD 12/28/21 [phone]

## 2021-12-28 NOTE — ED Notes (Signed)
Pt verbalizes understanding of discharge instructions. Opportunity for questioning and answers were provided. Pt discharged from ED to home. Pt states that he called for his ride.

## 2021-12-28 NOTE — ED Triage Notes (Addendum)
Pt presents for rectal pain since Sunday. Denies blood in stool, last BM today. Difficult to sit, stand, walk d/t pain. Has not tried any OTC remedies. Endorses urinary frequency but has been present since last surgery.  H/o back surgery 3 years ago without hardware.  Denies numbness in groin, dysuria, incontinence.

## 2022-05-12 ENCOUNTER — Other Ambulatory Visit: Payer: Self-pay

## 2022-05-12 ENCOUNTER — Encounter (HOSPITAL_BASED_OUTPATIENT_CLINIC_OR_DEPARTMENT_OTHER): Payer: Self-pay | Admitting: Emergency Medicine

## 2022-05-12 ENCOUNTER — Ambulatory Visit (HOSPITAL_BASED_OUTPATIENT_CLINIC_OR_DEPARTMENT_OTHER)
Admission: EM | Admit: 2022-05-12 | Discharge: 2022-05-13 | Disposition: A | Discharge status: Home / Self Care | Payer: 59 | Attending: Orthopedic Surgery | Admitting: Orthopedic Surgery

## 2022-05-12 ENCOUNTER — Emergency Department (HOSPITAL_BASED_OUTPATIENT_CLINIC_OR_DEPARTMENT_OTHER): Payer: 59 | Admitting: Radiology

## 2022-05-12 ENCOUNTER — Emergency Department (HOSPITAL_BASED_OUTPATIENT_CLINIC_OR_DEPARTMENT_OTHER): Payer: 59

## 2022-05-12 DIAGNOSIS — S0083XA Contusion of other part of head, initial encounter: Secondary | ICD-10-CM

## 2022-05-12 DIAGNOSIS — S0181XA Laceration without foreign body of other part of head, initial encounter: Secondary | ICD-10-CM

## 2022-05-12 DIAGNOSIS — Z87891 Personal history of nicotine dependence: Secondary | ICD-10-CM | POA: Insufficient documentation

## 2022-05-12 DIAGNOSIS — S63391A Traumatic rupture of other ligament of right wrist, initial encounter: Secondary | ICD-10-CM | POA: Diagnosis not present

## 2022-05-12 DIAGNOSIS — S63321A Traumatic rupture of right radiocarpal ligament, initial encounter: Secondary | ICD-10-CM | POA: Diagnosis not present

## 2022-05-12 DIAGNOSIS — R202 Paresthesia of skin: Secondary | ICD-10-CM | POA: Diagnosis not present

## 2022-05-12 DIAGNOSIS — D649 Anemia, unspecified: Secondary | ICD-10-CM

## 2022-05-12 DIAGNOSIS — M25331 Other instability, right wrist: Secondary | ICD-10-CM

## 2022-05-12 DIAGNOSIS — S0990XA Unspecified injury of head, initial encounter: Secondary | ICD-10-CM

## 2022-05-12 DIAGNOSIS — S63004A Unspecified dislocation of right wrist and hand, initial encounter: Secondary | ICD-10-CM | POA: Diagnosis not present

## 2022-05-12 DIAGNOSIS — M1990 Unspecified osteoarthritis, unspecified site: Secondary | ICD-10-CM | POA: Diagnosis not present

## 2022-05-12 DIAGNOSIS — S161XXA Strain of muscle, fascia and tendon at neck level, initial encounter: Secondary | ICD-10-CM

## 2022-05-12 DIAGNOSIS — S63094A Other dislocation of right wrist and hand, initial encounter: Secondary | ICD-10-CM | POA: Diagnosis present

## 2022-05-12 DIAGNOSIS — Z23 Encounter for immunization: Secondary | ICD-10-CM | POA: Diagnosis not present

## 2022-05-12 DIAGNOSIS — S63591A Other specified sprain of right wrist, initial encounter: Secondary | ICD-10-CM | POA: Insufficient documentation

## 2022-05-12 DIAGNOSIS — S39012A Strain of muscle, fascia and tendon of lower back, initial encounter: Secondary | ICD-10-CM

## 2022-05-12 DIAGNOSIS — E876 Hypokalemia: Secondary | ICD-10-CM

## 2022-05-12 LAB — COMPREHENSIVE METABOLIC PANEL
ALT: 13 U/L (ref 0–44)
AST: 19 U/L (ref 15–41)
Albumin: 4.4 g/dL (ref 3.5–5.0)
Alkaline Phosphatase: 48 U/L (ref 38–126)
Anion gap: 7 (ref 5–15)
BUN: 19 mg/dL (ref 6–20)
CO2: 27 mmol/L (ref 22–32)
Calcium: 9.3 mg/dL (ref 8.9–10.3)
Chloride: 101 mmol/L (ref 98–111)
Creatinine, Ser: 1.07 mg/dL (ref 0.61–1.24)
GFR, Estimated: >60 mL/min (ref >60)
Glucose, Bld: 103 mg/dL — ABNORMAL HIGH (ref 70–99)
Potassium: 3.4 mmol/L — ABNORMAL LOW (ref 3.5–5.1)
Sodium: 135 mmol/L (ref 135–145)
Total Bilirubin: 0.6 mg/dL (ref 0.3–1.2)
Total Protein: 7.9 g/dL (ref 6.5–8.1)

## 2022-05-12 LAB — CBC
HCT: 37.2 % — ABNORMAL LOW (ref 39.0–52.0)
Hemoglobin: 12.3 g/dL — ABNORMAL LOW (ref 13.0–17.0)
MCH: 29.4 pg (ref 26.0–34.0)
MCHC: 33.1 g/dL (ref 30.0–36.0)
MCV: 88.8 fL (ref 80.0–100.0)
Platelets: 228 10*3/uL (ref 150–400)
RBC: 4.19 MIL/uL — ABNORMAL LOW (ref 4.22–5.81)
RDW: 15.8 % — ABNORMAL HIGH (ref 11.5–15.5)
WBC: 5.8 10*3/uL (ref 4.0–10.5)
nRBC: 0 % (ref 0.0–0.2)

## 2022-05-12 LAB — LACTIC ACID, PLASMA: Lactic Acid, Venous: 0.6 mmol/L (ref 0.5–1.9)

## 2022-05-12 LAB — URINALYSIS, ROUTINE W REFLEX MICROSCOPIC
Bilirubin Urine: NEGATIVE
Glucose, UA: NEGATIVE mg/dL
Hgb urine dipstick: NEGATIVE
Ketones, ur: NEGATIVE mg/dL
Leukocytes,Ua: NEGATIVE
Nitrite: NEGATIVE
Specific Gravity, Urine: 1.029 (ref 1.005–1.030)
pH: 5.5 (ref 5.0–8.0)

## 2022-05-12 LAB — ETHANOL: Alcohol, Ethyl (B): <10 mg/dL (ref <10)

## 2022-05-12 LAB — PROTIME-INR
INR: 1 (ref 0.8–1.2)
Prothrombin Time: 12.9 seconds (ref 11.4–15.2)

## 2022-05-12 MED ORDER — IOHEXOL 300 MG/ML  SOLN
100.0000 mL | Freq: Once | INTRAMUSCULAR | Status: AC | PRN
  Administered 2022-05-12: 100 mL via INTRAVENOUS

## 2022-05-12 MED ORDER — TETANUS-DIPHTH-ACELL PERTUSSIS 5-2.5-18.5 LF-MCG/0.5 IM SUSY: 0.5000 mL | PREFILLED_SYRINGE | Freq: Once | INTRAMUSCULAR | Status: DC

## 2022-05-12 MED ORDER — TETANUS-DIPHTH-ACELL PERTUSSIS 5-2.5-18.5 LF-MCG/0.5 IM SUSY
PREFILLED_SYRINGE | INTRAMUSCULAR | Status: AC
  Administered 2022-05-12: 0.5 mL via INTRAMUSCULAR
  Filled 2022-05-12: qty 0.5

## 2022-05-12 MED ORDER — FENTANYL CITRATE PF 50 MCG/ML IJ SOSY
50.0000 ug | PREFILLED_SYRINGE | Freq: Once | INTRAMUSCULAR | Status: AC
  Administered 2022-05-12: 50 ug via INTRAVENOUS
  Filled 2022-05-12: qty 1
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
## 2022-05-12 NOTE — ED Notes (Signed)
Patient also endorses abd cramping, no tenderness with palpation.

## 2022-05-12 NOTE — ED Notes (Signed)
Patient is right handed, unable to sign MSE.

## 2022-05-12 NOTE — ED Notes (Signed)
Patient transported to CT 

## 2022-05-13 ENCOUNTER — Other Ambulatory Visit: Payer: Self-pay

## 2022-05-13 ENCOUNTER — Emergency Department (HOSPITAL_COMMUNITY): Payer: 59 | Admitting: Anesthesiology

## 2022-05-13 ENCOUNTER — Encounter (HOSPITAL_COMMUNITY): Payer: Self-pay | Admitting: Orthopedic Surgery

## 2022-05-13 ENCOUNTER — Emergency Department (HOSPITAL_COMMUNITY): Payer: 59

## 2022-05-13 ENCOUNTER — Emergency Department (HOSPITAL_BASED_OUTPATIENT_CLINIC_OR_DEPARTMENT_OTHER): Payer: 59

## 2022-05-13 ENCOUNTER — Emergency Department (EMERGENCY_DEPARTMENT_HOSPITAL): Payer: 59 | Admitting: Anesthesiology

## 2022-05-13 ENCOUNTER — Encounter (HOSPITAL_COMMUNITY)
Admission: EM | Disposition: A | Discharge status: Home / Self Care | Payer: Self-pay | Source: Home / Self Care | Attending: Emergency Medicine

## 2022-05-13 DIAGNOSIS — S63004A Unspecified dislocation of right wrist and hand, initial encounter: Secondary | ICD-10-CM

## 2022-05-13 DIAGNOSIS — S63321A Traumatic rupture of right radiocarpal ligament, initial encounter: Secondary | ICD-10-CM

## 2022-05-13 DIAGNOSIS — M1990 Unspecified osteoarthritis, unspecified site: Secondary | ICD-10-CM

## 2022-05-13 DIAGNOSIS — S63391A Traumatic rupture of other ligament of right wrist, initial encounter: Secondary | ICD-10-CM

## 2022-05-13 HISTORY — PX: ORIF WRIST FRACTURE: SHX2133

## 2022-05-13 LAB — SURGICAL PCR SCREEN
MRSA, PCR: NEGATIVE
Staphylococcus aureus: NEGATIVE

## 2022-05-13 SURGERY — OPEN REDUCTION INTERNAL FIXATION (ORIF) WRIST FRACTURE
Anesthesia: Monitor Anesthesia Care | Site: Wrist | Laterality: Right

## 2022-05-13 MED ORDER — MORPHINE SULFATE (PF) 4 MG/ML IV SOLN
4.0000 mg | Freq: Once | INTRAVENOUS | Status: AC
  Administered 2022-05-13: 4 mg via INTRAVENOUS
  Filled 2022-05-13: qty 1

## 2022-05-13 MED ORDER — OXYCODONE-ACETAMINOPHEN 5-325 MG PO TABS: 1.0000 | ORAL_TABLET | Freq: Four times a day (QID) | ORAL | 0 refills | Status: DC | PRN

## 2022-05-13 MED ORDER — CHLORHEXIDINE GLUCONATE 4 % EX LIQD: 60.0000 mL | Freq: Once | CUTANEOUS | Status: DC

## 2022-05-13 MED ORDER — MIDAZOLAM HCL 2 MG/2ML IJ SOLN
INTRAMUSCULAR | Status: DC | PRN
  Administered 2022-05-13: 2 mg via INTRAVENOUS

## 2022-05-13 MED ORDER — CHLORHEXIDINE GLUCONATE 0.12 % MT SOLN
15.0000 mL | Freq: Once | OROMUCOSAL | Status: AC
  Administered 2022-05-13: 15 mL via OROMUCOSAL
  Filled 2022-05-13: qty 15

## 2022-05-13 MED ORDER — CEFAZOLIN SODIUM-DEXTROSE 2-4 GM/100ML-% IV SOLN
2.0000 g | INTRAVENOUS | Status: AC
  Administered 2022-05-13: 2 g via INTRAVENOUS
  Filled 2022-05-13: qty 100

## 2022-05-13 MED ORDER — ORAL CARE MOUTH RINSE: 15.0000 mL | Freq: Once | OROMUCOSAL | Status: AC

## 2022-05-13 MED ORDER — PROPOFOL 500 MG/50ML IV EMUL
INTRAVENOUS | Status: DC | PRN
  Administered 2022-05-13: 50 ug/kg/min via INTRAVENOUS

## 2022-05-13 MED ORDER — MIDAZOLAM HCL 2 MG/2ML IJ SOLN
INTRAMUSCULAR | Status: AC
  Filled 2022-05-13: qty 2

## 2022-05-13 MED ORDER — LIDOCAINE HCL 1 % IJ SOLN
INTRAMUSCULAR | Status: AC
  Filled 2022-05-13: qty 20

## 2022-05-13 MED ORDER — LACTATED RINGERS IV SOLN: INTRAVENOUS | Status: DC

## 2022-05-13 MED ORDER — BUPIVACAINE-EPINEPHRINE (PF) 0.5% -1:200000 IJ SOLN
INTRAMUSCULAR | Status: DC | PRN
  Administered 2022-05-13: 30 mL via PERINEURAL

## 2022-05-13 MED ORDER — 0.9 % SODIUM CHLORIDE (POUR BTL) OPTIME
TOPICAL | Status: DC | PRN
  Administered 2022-05-13: 1000 mL

## 2022-05-13 MED ORDER — BACITRACIN ZINC 500 UNIT/GM EX OINT
TOPICAL_OINTMENT | CUTANEOUS | Status: AC
  Filled 2022-05-13: qty 28.35

## 2022-05-13 MED ORDER — FENTANYL CITRATE (PF) 250 MCG/5ML IJ SOLN
INTRAMUSCULAR | Status: DC | PRN
  Administered 2022-05-13 (×2): 25 ug via INTRAVENOUS

## 2022-05-13 MED ORDER — OXYCODONE-ACETAMINOPHEN 5-325 MG PO TABS: 1.0000 | ORAL_TABLET | Freq: Four times a day (QID) | ORAL | 0 refills | Status: AC | PRN

## 2022-05-13 MED ORDER — BACITRACIN ZINC 500 UNIT/GM EX OINT
TOPICAL_OINTMENT | CUTANEOUS | Status: DC | PRN
  Administered 2022-05-13: 1 via TOPICAL

## 2022-05-13 MED ORDER — POVIDONE-IODINE 10 % EX SWAB
2.0000 "application " | Freq: Once | CUTANEOUS | Status: AC
  Administered 2022-05-13: 2 via TOPICAL

## 2022-05-13 MED ORDER — LIDOCAINE 2% (20 MG/ML) 5 ML SYRINGE
INTRAMUSCULAR | Status: AC
  Filled 2022-05-13: qty 5

## 2022-05-13 MED ORDER — LIDOCAINE 2% (20 MG/ML) 5 ML SYRINGE
INTRAMUSCULAR | Status: DC | PRN
  Administered 2022-05-13: 40 mg via INTRAVENOUS

## 2022-05-13 MED ORDER — PROPOFOL 10 MG/ML IV BOLUS
INTRAVENOUS | Status: DC | PRN
  Administered 2022-05-13: 50 mg via INTRAVENOUS

## 2022-05-13 MED ORDER — FENTANYL CITRATE (PF) 100 MCG/2ML IJ SOLN
INTRAMUSCULAR | Status: AC
  Filled 2022-05-13: qty 2

## 2022-05-13 MED ORDER — FENTANYL CITRATE (PF) 250 MCG/5ML IJ SOLN
INTRAMUSCULAR | Status: AC
  Filled 2022-05-13: qty 5

## 2022-05-13 MED ORDER — HYDROMORPHONE HCL 1 MG/ML IJ SOLN: 0.2500 mg | INTRAMUSCULAR | Status: DC | PRN

## 2022-05-13 SURGICAL SUPPLY — 45 items
BAG COUNTER SPONGE SURGICOUNT (BAG) ×2 IMPLANT
BAG SPNG CNTER NS LX DISP (BAG) ×1
BNDG CMPR 9X4 STRL LF SNTH (GAUZE/BANDAGES/DRESSINGS) ×1
BNDG ELASTIC 3X5.8 VLCR STR LF (GAUZE/BANDAGES/DRESSINGS) ×1 IMPLANT
BNDG ELASTIC 4X5.8 VLCR STR LF (GAUZE/BANDAGES/DRESSINGS) ×3 IMPLANT
BNDG ESMARK 4X9 LF (GAUZE/BANDAGES/DRESSINGS) ×2 IMPLANT
BNDG GAUZE ELAST 4 BULKY (GAUZE/BANDAGES/DRESSINGS) ×1 IMPLANT
BNDG PLASTER X FAST 4X5 WHT LF (CAST SUPPLIES) ×3 IMPLANT
BNDG PLSTR 5X4 XFST ST WHT LF (CAST SUPPLIES) ×3
CANISTER SUCT 3000ML PPV (MISCELLANEOUS) ×2 IMPLANT
CORD BIPOLAR FORCEPS 12FT (ELECTRODE) ×2 IMPLANT
COVER SURGICAL LIGHT HANDLE (MISCELLANEOUS) ×2 IMPLANT
CUFF TOURN SGL QUICK 18X4 (TOURNIQUET CUFF) ×2 IMPLANT
DRAPE OEC MINIVIEW 54X84 (DRAPES) ×2 IMPLANT
DRSG ADAPTIC 3X8 NADH LF (GAUZE/BANDAGES/DRESSINGS) ×2 IMPLANT
DRSG EMULSION OIL 3X3 NADH (GAUZE/BANDAGES/DRESSINGS) ×2 IMPLANT
GAUZE SPONGE 4X4 12PLY STRL (GAUZE/BANDAGES/DRESSINGS) ×3 IMPLANT
GLOVE SS BIOGEL STRL SZ 8 (GLOVE) ×1 IMPLANT
GLOVE SUPERSENSE BIOGEL SZ 8 (GLOVE) ×1
GLOVE SURG UNDER POLY LF SZ7.5 (GLOVE) ×8 IMPLANT
GOWN STRL REUS W/ TWL LRG LVL3 (GOWN DISPOSABLE) ×2 IMPLANT
GOWN STRL REUS W/TWL LRG LVL3 (GOWN DISPOSABLE) ×4
HIBICLENS CHG 4% 4OZ BTL (MISCELLANEOUS) ×2 IMPLANT
K-WIRE DBL TROCAR .045X4 (WIRE) ×16
KIT BASIN OR (CUSTOM PROCEDURE TRAY) ×2 IMPLANT
KIT TURNOVER KIT B (KITS) ×2 IMPLANT
KWIRE DBL TROCAR .045X4 (WIRE) IMPLANT
NS IRRIG 1000ML POUR BTL (IV SOLUTION) ×2 IMPLANT
PACK ORTHO EXTREMITY (CUSTOM PROCEDURE TRAY) ×2 IMPLANT
PAD ARMBOARD 7.5X6 YLW CONV (MISCELLANEOUS) ×4 IMPLANT
PAD CAST 3X4 CTTN HI CHSV (CAST SUPPLIES) ×1 IMPLANT
PAD CAST 4YDX4 CTTN HI CHSV (CAST SUPPLIES) ×1 IMPLANT
PADDING CAST COTTON 3X4 STRL (CAST SUPPLIES)
PADDING CAST COTTON 4X4 STRL (CAST SUPPLIES) ×6
SLING ARM ELEVATOR 10X16 (CAST SUPPLIES) ×1 IMPLANT
SLING ARM FOAM STRAP LRG (SOFTGOODS) ×1 IMPLANT
SPLINT FIBERGLASS 3X35 (CAST SUPPLIES) ×1 IMPLANT
SUT ETHILON 4 0 PS 2 18 (SUTURE) ×4 IMPLANT
SUT VIC AB 0 CT2 27 (SUTURE) ×1 IMPLANT
SUT VIC AB 3-0 SH 27 (SUTURE) ×2
SUT VIC AB 3-0 SH 27X BRD (SUTURE) IMPLANT
TOWEL GREEN STERILE (TOWEL DISPOSABLE) ×2 IMPLANT
TUBE CONNECTING 12X1/4 (SUCTIONS) ×2 IMPLANT
UNDERPAD 30X36 HEAVY ABSORB (UNDERPADS AND DIAPERS) ×2 IMPLANT
WATER STERILE IRR 1000ML POUR (IV SOLUTION) ×2 IMPLANT

## 2022-05-13 NOTE — Discharge Instructions (Addendum)
  Orthopaedic Hand Surgery Discharge Instructions  WEIGHT BEARING STATUS: Non weight bearing on operative extremity  DRESSING CARE: Please keep your dressing/splint/cast clean and dry until your follow-up appointment. You may shower by placing a waterproof covering over your dressing/splint/cast. Contact your surgeon if your splint/cast gets wet. It will need to be changed to prevent skin breakdown.  PAIN CONTROL: First line medications for post operative pain control are Tylenol (acetaminophen) and Motrin (ibuprofen) if you are able to take these medications. If you have been prescribed a medication these can be taken as breakthrough pain medications. Please note that some narcotic pain medication has acetaminophen added and you should never consume more than 4,000mg of acetaminophen in 24-hour period. Please note that if you are given Toradol (ketorolac) you should not take similar medications such as ibuprofen or naproxen.  DISCHARGE MEDICATIONS: If you have been prescribed medication it was sent electronically to your pharmacy. No changes have been made to your home medications.  ICE/ELEVATION: Ice and elevate your injured extremity as needed. Avoid direct contact of ice with skin.   BANDAGE FEELS TOO TIGHT: If your bandage feels too tight, first make sure you are elevating your fingers as much as possible. The outer layer of the bandage can be unwrapped and reapplied more loosely. If no improvement, you may carefully cut the inner layer longitudinally until the pressure has resolved and then rewrap the outer layer. If you are not comfortable with these instructions, please call the office and the bandage can be changed for you.   FOLLOW UP: You will be called after surgery with an appointment date and time, however if you have not received a phone call within 3 days, please call during regular office hours at [phone] to schedule a post operative appointment.  Please Seek Medical Attention  if: Call MD for: pain or pressure in chest, jaw, arm, back, neck  Call MD for: temperature greater than 101 F for more than 24 hrs Call MD for: difficulty breathing Call MD for: incision redness, bleeding, drainage  Call MD for: palpitations or feeling that the heart is racing  Call MD for: increased swelling in arm, leg, ankle, or abdomen  Call MD for: lightheadedness, dizziness, fainting Call 911 or go to ER for any medical emergency if you are not able to get in touch with your doctor   J. Reid Dinita Migliaccio, MD Orthopaedic Hand Surgeon [facility] Office number: [phone] [address] , [place] [address]  

## 2022-05-13 NOTE — ED Notes (Signed)
Dr Yehuda Budd in room with patient

## 2022-05-13 NOTE — ED Notes (Signed)
Patient transported to CT 

## 2022-05-13 NOTE — Anesthesia Procedure Notes (Signed)
Procedure Name: MAC Date/Time: 05/13/2022 11:10 AM Performed by: Lorie Phenix, CRNA Pre-anesthesia Checklist: Patient identified, Emergency Drugs available, Suction available and Patient being monitored Patient Re-evaluated:Patient Re-evaluated prior to induction Oxygen Delivery Method: Nasal cannula Placement Confirmation: positive ETCO2

## 2022-05-13 NOTE — Anesthesia Postprocedure Evaluation (Signed)
Anesthesia Post Note  Patient: Sergio Boyd  Procedure(s) Performed: OPEN REDUCTION INTERNAL FIXATION (ORIF) WRIST FRACTURE (Right: Wrist)     Patient location during evaluation: PACU Anesthesia Type: Regional and MAC Level of consciousness: awake and alert Pain management: pain level controlled Vital Signs Assessment: post-procedure vital signs reviewed and stable Respiratory status: spontaneous breathing, nonlabored ventilation and respiratory function stable Cardiovascular status: stable and blood pressure returned to baseline Postop Assessment: no apparent nausea or vomiting Anesthetic complications: no   No notable events documented.  Last Vitals:  Vitals:   05/13/22 1408 05/13/22 1421  BP: (!) 166/79 (!) 166/83  Pulse: (!) 36 (!) 40  Resp: 16 13  Temp:  (!) 36.2 C  SpO2: 99% 98%    Last Pain:  Vitals:   05/13/22 1408  TempSrc:   PainSc: 0-No pain                 Lirio Bach,W. EDMOND

## 2022-05-13 NOTE — Op Note (Signed)
OPERATIVE NOTE  DATE OF PROCEDURE: 05/13/2022  SURGEONS:  Primary: Orene Desanctis, MD  PREOPERATIVE DIAGNOSIS: Right wrist lunate dislocation, SL and LT ligament tears, volar radiocarpal ligament tears, median nerve paresthesias  POSTOPERATIVE DIAGNOSIS: Same  NAME OF PROCEDURE:   Right wrist open reduction of lunate dislocation Right wrist repair of SL and LT ligaments via dorsal approach Right wrist carpal tunnel release Right wrist volar radiocarpal ligament repairs via volar approach Right wrist 4 view radiographs with intraoperative interpretation  ANESTHESIA: MAC + Block  SKIN PREPARATION: Hibiclens  ESTIMATED BLOOD LOSS: Minimal  IMPLANTS: 0.045 k wires x 7  INDICATIONS:  Sergio Boyd is a 51 y.o. male who has the above preoperative diagnosis. The patient has decided to proceed with surgical intervention.  Risks, benefits and alternatives of operative management were discussed including, but not limited to, risks of anesthesia complications, infection, pain, persistent symptoms, stiffness, need for future surgery.  The patient understands, agrees and elects to proceed with surgery.    DESCRIPTION OF PROCEDURE: The patient was met in the pre-operative area and their identity was verified.  The operative location and laterality was also verified and marked.  The patient was brought to the OR and was placed supine on the table.  After repeat patient identification with the operative team anesthesia was provided and the patient was prepped and draped in the usual sterile fashion.  A final timeout was performed verifying the correction patient, procedure, location and laterality.  The right upper extremity was elevated exsanguinated with an Esmarch and tourniquet inflated to 250 mmHg after the antibiotics were provided.  The surgery began by making a volar incision across the wrist crease in order to release the carpal tunnel and decompress the median nerve.  Skin and subcutaneous tissues  were divided and the transverse carpal ligament was released including the distal forearm fascia.  At this time the median nerve was mobilized in addition to the flexor tendons radially and the lunate was visible within the carpal canal.  This was washed and an open reduction of the lunate dislocation was performed with traction and manual reduction of the lunate.  The volar radiocarpal ligaments were identified and were completely torn all the way from radial to ulnar off of the rim of the distal radius.  At this time attention was turned to a dorsal approach to the wrist centered over Lister's tubercle.  Skin subcutaneous tissues were divided and careful hemostasis was obtained with bipolar electrocautery.  The second third and fourth extensor compartments were identified and the tendons were protected.  The capsule was incised in an inverted T fashion and the carpus was identified.  There was mid substance rupture of the scapholunate ligament and lunotriquetral ligaments.  The lunate was back underneath the capitate however in a DISI deformity.  At this time repair of the scapholunate ligament and lunotriquetral ligament was performed with stabilization of the proximal carpal row with K wires and primary repair.  A pointed reduction clamp was utilized to close down the scapholunate ligament interval with excellent reduction.  Multiple K wires were placed across the proximal carpal row and across the midcarpal joint in a diamond configuration.  The K wires were placed from radial were placed after an incision was made to protect the dorsal branch of the radial artery and radial sensory nerve.  4 view radiographs of the right wrist confirmed adequate reduction hardware placement and the pins were then cut beneath the skin surface.  At this time attention was turned  to primary repair of the volar radiocarpal ligaments and the floor of the carpal tunnel.  Again the median nerve was gently retracted radially and the  ulnar neurovascular bundle was retracted ulnarly and the repair was performed with 0 Vicryl suture which was deep buried in an interrupted fashion with excellent repair.  At this time the wound was thoroughly irrigated final radiographs were obtained and confirmed adequate length alignment and rotation of the lunate dislocation with tears of the scapholunate ligament lunotriquetral ligament and volar greater carpal ligament with restoration of lunate tilt and scapholunate and lunotriquetral interval widening with proper placement of the pins.  3-0 Vicryl suture was utilized to close the dorsal capsule of the right wrist and the remainder of the incisions were closed with 4-0 nylon suture in horizontal mattress fashion.  A sterile soft bandage was applied followed by a sugar-tong splint.  The tourniquet was deflated and the fingers were pink and warm and well-perfused at the end of the procedure.  The compartments of the hand and forearm are soft and compressible.  The patient was awoken from anesthesia brought to PACU for recovery in stable condition.   Sergio Holmes, MD

## 2022-05-13 NOTE — ED Notes (Signed)
Report called to [facility] charge nurse Ophelia Charter, RN. Pt coming POV transfer for hand surgery consult. IV in place, mother driving pt with paperwork. Pt and mother given instructions to go straight to [facility]. Pt left ambulatory with sling applied.

## 2022-05-13 NOTE — ED Provider Notes (Signed)
Patient transferred from [facility] for hand surgery evaluation.  Patient has been n.p.o.  Hand surgeon consulted for further management.   Quintella Reichert, MD 05/13/22 [phone]

## 2022-05-13 NOTE — Transfer of Care (Addendum)
Immediate Anesthesia Transfer of Care Note  Patient: Sergio Boyd  Procedure(s) Performed: OPEN REDUCTION INTERNAL FIXATION (ORIF) WRIST FRACTURE (Right: Wrist)  Patient Location: PACU  Anesthesia Type:MAC and Regional  Level of Consciousness: awake and alert   Airway & Oxygen Therapy: Patient Spontanous Breathing  Post-op Assessment: Report given to RN and Post -op Vital signs reviewed and stable  Post vital signs: Reviewed and stable  Last Vitals:  Vitals Value Taken Time  BP 169/77 05/13/22 1336  Temp    Pulse 56 05/13/22 1338  Resp 19 05/13/22 1338  SpO2 100 % 05/13/22 1338  Vitals shown include unvalidated device data.  Last Pain:  Vitals:   05/13/22 1018  TempSrc:   PainSc: 10-Worst pain ever      Patients Stated Pain Goal: 3 (63/78/58 8502)  Complications: No notable events documented.

## 2022-05-13 NOTE — Anesthesia Preprocedure Evaluation (Addendum)
Anesthesia Evaluation  Patient identified by MRN, date of birth, ID band Patient awake    Reviewed: Allergy & Precautions, H&P , NPO status , Patient's Chart, lab work & pertinent test results  Airway Mallampati: III  TM Distance: >3 FB Neck ROM: Full    Dental no notable dental hx. (+) Teeth Intact, Dental Advisory Given   Pulmonary Current Smoker and Patient abstained from smoking.,    Pulmonary exam normal breath sounds clear to auscultation       Cardiovascular negative cardio ROS   Rhythm:Regular Rate:Normal     Neuro/Psych  Headaches, negative psych ROS   GI/Hepatic Neg liver ROS, GERD  Medicated,  Endo/Other  negative endocrine ROS  Renal/GU negative Renal ROS  negative genitourinary   Musculoskeletal  (+) Arthritis , Osteoarthritis,    Abdominal   Peds  Hematology negative hematology ROS (+)   Anesthesia Other Findings   Reproductive/Obstetrics negative OB ROS                            Anesthesia Physical Anesthesia Plan  ASA: 2  Anesthesia Plan: Regional and MAC   Post-op Pain Management: Ofirmev IV (intra-op)* and Minimal or no pain anticipated   Induction: Intravenous  PONV Risk Score and Plan: 0 and Propofol infusion, Midazolam and Ondansetron  Airway Management Planned: Natural Airway and Simple Face Mask  Additional Equipment:   Intra-op Plan:   Post-operative Plan:   Informed Consent: I have reviewed the patients History and Physical, chart, labs and discussed the procedure including the risks, benefits and alternatives for the proposed anesthesia with the patient or authorized representative who has indicated his/her understanding and acceptance.     Dental advisory given  Plan Discussed with: CRNA  Anesthesia Plan Comments:         Anesthesia Quick Evaluation

## 2022-05-13 NOTE — ED Notes (Signed)
Yehuda Budd, MD paged to follow up with patient plan. Awaiting answer at this time

## 2022-05-13 NOTE — ED Notes (Signed)
The pt came [facility] to have surgery at  this hospital
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
## 2022-05-13 NOTE — ED Notes (Signed)
Patient going ED to ED for  a dislocation of the wrist

## 2022-05-13 NOTE — Anesthesia Procedure Notes (Signed)
Anesthesia Regional Block: Supraclavicular block   Pre-Anesthetic Checklist: , timeout performed,  Correct Patient, Correct Site, Correct Laterality,  Correct Procedure, Correct Position, site marked,  Risks and benefits discussed,  Pre-op evaluation,  At surgeon's request and post-op pain management  Laterality: Right  Prep: Maximum Sterile Barrier Precautions used, chloraprep       Needles:  Injection technique: Single-shot  Needle Type: Echogenic Stimulator Needle     Needle Length: 9cm  Needle Gauge: 21     Additional Needles:   Procedures:,,,, ultrasound used (permanent image in chart),,    Narrative:  Start time: 05/13/2022 10:36 AM End time: 05/13/2022 10:46 AM Injection made incrementally with aspirations every 5 mL.  Performed by: Personally  Anesthesiologist: Gaynelle Adu, MD  Additional Notes:

## 2022-05-14 ENCOUNTER — Encounter (HOSPITAL_COMMUNITY): Payer: Self-pay | Admitting: Orthopedic Surgery

## 2022-07-06 ENCOUNTER — Encounter (HOSPITAL_BASED_OUTPATIENT_CLINIC_OR_DEPARTMENT_OTHER): Payer: Self-pay | Admitting: Orthopedic Surgery

## 2022-07-07 ENCOUNTER — Encounter (HOSPITAL_BASED_OUTPATIENT_CLINIC_OR_DEPARTMENT_OTHER): Payer: Self-pay | Admitting: Orthopedic Surgery

## 2022-07-07 NOTE — Progress Notes (Signed)
Spoke w/ via phone for pre-op interview--- [employer]----     NONE          Lab results------ COVID test -----patient states asymptomatic no test needed Arrive at -------0900 NPO after MN NO Solid Food.  Clear liquids from MN until---0800 Med rec completed Medications to take morning of surgery -----NONE Diabetic medication ----- Patient instructed no nail polish to be worn day of surgery Patient instructed to bring photo id and insurance card day of surgery Patient aware to have Driver (ride ) / caregiver Baldo Ash   for 24 hours after surgery  Patient Special Instructions ----- Pre-Op special Istructions ----- Patient verbalized understanding of instructions that were given at this phone interview. Patient denies shortness of breath, chest pain, fever, cough at this phone interview.

## 2022-07-10 NOTE — H&P (Signed)
Preoperative History & Physical Exam  Surgeon: Philipp Ovens, MD  Diagnosis: Right wrist lunate dislocation s/p ORIF  Planned Procedure: Procedure(s) (LRB): Right wrist removal of deep buried pins x 7 (Right)  History of Present Illness:   Patient is a 51 y.o. male with symptoms consistent with Right wrist lunate dislocation s/p ORIF who presents for surgical intervention. The risks, benefits and alternatives of surgical intervention were discussed and informed consent was obtained prior to surgery.  Past Medical History:  Past Medical History:  Diagnosis Date   Arthritis    Chronic back pain    herniated disc   GERD (gastroesophageal reflux disease)    Headache    History of migraine    Joint pain     Past Surgical History:  Past Surgical History:  Procedure Laterality Date   INGUINAL HERNIA REPAIR Right    INGUINAL HERNIA REPAIR Right 11/23/2015   Procedure: RIGHT INGUINAL HERNIA REPAIR W/MESH;  Surgeon: Avel Peace, MD;  Location: [facility] OR;  Service: General;  Laterality: Right;   INSERTION OF MESH Right 11/23/2015   Procedure: INSERTION OF MESH;  Surgeon: Avel Peace, MD;  Location: [facility] OR;  Service: General;  Laterality: Right;   KNEE SURGERY     right ACL 2012   LUMBAR LAMINECTOMY/DECOMPRESSION MICRODISCECTOMY N/A 04/20/2017   Procedure: Re Operative Right Lumbar five-Sacral one Laminectomy/Recurrent discectomy, Left Lumbar four-five Laminectomy for decompression, pseudomeningocele repair;  Surgeon: Ditty, Loura Halt, MD;  Location: [facility] OR;  Service: Neurosurgery;  Laterality: N/A;   LUMBAR SPINE SURGERY  02/2017   Dr. Bevely Palmer   LUMBAR WOUND DEBRIDEMENT N/A 04/24/2017   Procedure: Repair of Pseudomeningocele and Placement of Lumbar Drain;  Surgeon: Ditty, Loura Halt, MD;  Location: [facility] OR;  Service: Neurosurgery;  Laterality: N/A;   ORIF WRIST FRACTURE Right 05/13/2022   Procedure: OPEN REDUCTION INTERNAL FIXATION (ORIF) WRIST FRACTURE;  Surgeon: Gomez Cleverly,  MD;  Location: [facility] OR;  Service: Orthopedics;  Laterality: Right;   PLACEMENT OF LUMBAR DRAIN N/A 04/24/2017   Procedure: PLACEMENT OF LUMBAR DRAIN;  Surgeon: Ditty, Loura Halt, MD;  Location: [facility] OR;  Service: Neurosurgery;  Laterality: N/A;    Medications:  Prior to Admission medications   Medication Sig Start Date End Date Taking? Authorizing Provider  ibuprofen (ADVIL,MOTRIN) 800 MG tablet Take 1 tablet (800 mg total) by mouth every 8 (eight) hours as needed for moderate pain. 11/28/18  Yes Tysinger, Kermit Balo, PA-C  lidocaine (LIDODERM) 5 % Place 1 patch onto the skin daily. Remove & Discard patch within 12 hours or as directed by MD Patient not taking: Reported on 05/13/2022 01/16/19   Harlene Salts A, PA-C  methocarbamol (ROBAXIN) 500 MG tablet Take 1 tablet (500 mg total) by mouth at bedtime. Patient not taking: Reported on 05/13/2022 05/17/20   Kirtland Bouchard, PA-C  methocarbamol (ROBAXIN) 750 MG tablet Take 750 mg by mouth 2 (two) times daily as needed. Patient not taking: Reported on 07/07/2022 12/17/21   [provider]  pregabalin (LYRICA) 50 MG capsule Take 50 mg by mouth 2 (two) times daily. Patient not taking: Reported on 07/07/2022 12/17/21   [provider]    Allergies:  Chloraseptic sore throat [acetaminophen] and Pork-derived products  Review of Systems: Negative except per HPI.  Physical Exam: Alert and oriented, NAD Head and neck: no masses, normal alignment CV: pulse intact Pulm: no increased work of breathing, respirations even and unlabored Abdomen: non-distended Extremities: extremities warm and well perfused  LABS: Recent Results (from  the past 2160 hour(s))  Comprehensive metabolic panel     Status: Abnormal   Collection Time: 05/12/22 10:24 PM  Result Value Ref Range   Sodium 135 135 - 145 mmol/L   Potassium 3.4 (L) 3.5 - 5.1 mmol/L   Chloride 101 98 - 111 mmol/L   CO2 27 22 - 32 mmol/L   Glucose, Bld 103 (H) 70 - 99 mg/dL    Comment:  Glucose reference range applies only to samples taken after fasting for at least 8 hours.   BUN 19 6 - 20 mg/dL   Creatinine, Ser 4.09 0.61 - 1.24 mg/dL   Calcium 9.3 8.9 - 81.1 mg/dL   Total Protein 7.9 6.5 - 8.1 g/dL   Albumin 4.4 3.5 - 5.0 g/dL   AST 19 15 - 41 U/L   ALT 13 0 - 44 U/L   Alkaline Phosphatase 48 38 - 126 U/L   Total Bilirubin 0.6 0.3 - 1.2 mg/dL   GFR, Estimated >91 >47 mL/min    Comment: (NOTE) Calculated using the CKD-EPI Creatinine Equation (2021)    Anion gap 7 5 - 15    Comment: Performed at [facility], [address] [place], [place] [address]  CBC     Status: Abnormal   Collection Time: 05/12/22 10:24 PM  Result Value Ref Range   WBC 5.8 4.0 - 10.5 K/uL   RBC 4.19 (L) 4.22 - 5.81 MIL/uL   Hemoglobin 12.3 (L) 13.0 - 17.0 g/dL   HCT 21.3 (L) 08.6 - 57.8 %   MCV 88.8 80.0 - 100.0 fL   MCH 29.4 26.0 - 34.0 pg   MCHC 33.1 30.0 - 36.0 g/dL   RDW 46.9 (H) 62.9 - 52.8 %   Platelets 228 150 - 400 K/uL   nRBC 0.0 0.0 - 0.2 %    Comment: Performed at [facility], [address] [place], [place] [address]  Ethanol     Status: None   Collection Time: 05/12/22 10:24 PM  Result Value Ref Range   Alcohol, Ethyl (B) <10 <10 mg/dL    Comment: (NOTE) Lowest detectable limit for serum alcohol is 10 mg/dL.  For medical purposes only. Performed at [facility], [address] [place], [place] [address]   Urinalysis, Routine w reflex microscopic Urine, Clean Catch     Status: Abnormal   Collection Time: 05/12/22 10:24 PM  Result Value Ref Range   Color, Urine YELLOW YELLOW   APPearance CLEAR CLEAR   Specific Gravity, Urine 1.029 1.005 - 1.030   pH 5.5 5.0 - 8.0   Glucose, UA NEGATIVE NEGATIVE mg/dL   Hgb urine dipstick NEGATIVE NEGATIVE   Bilirubin Urine NEGATIVE NEGATIVE   Ketones, ur NEGATIVE NEGATIVE mg/dL   Protein, ur TRACE (A) NEGATIVE mg/dL   Nitrite NEGATIVE NEGATIVE   Leukocytes,Ua  NEGATIVE NEGATIVE    Comment: Performed at [facility], [address], [place], [place] [address]  Lactic acid, plasma     Status: None   Collection Time: 05/12/22 10:24 PM  Result Value Ref Range   Lactic Acid, Venous 0.6 0.5 - 1.9 mmol/L    Comment: Performed at [facility], [address] [place], [place] [address]  Protime-INR     Status: None   Collection Time: 05/12/22 10:24 PM  Result Value Ref Range   Prothrombin Time 12.9 11.4 - 15.2 seconds   INR 1.0 0.8 - 1.2    Comment: (NOTE) INR goal varies based on device  and disease states. Performed at [facility], [address] [place], [place] [address]   Surgical pcr screen     Status: None   Collection Time: 05/13/22 10:07 AM   Specimen: Nasal Mucosa; Nasal Swab  Result Value Ref Range   MRSA, PCR NEGATIVE NEGATIVE   Staphylococcus aureus NEGATIVE NEGATIVE    Comment: (NOTE) The Xpert SA Assay (FDA approved for NASAL specimens in patients 71 years of age and older), is one component of a comprehensive surveillance program. It is not intended to diagnose infection nor to guide or monitor treatment. Performed at [facility], [address], [place], [place] [address]      Complete History and Physical exam available in the office notes  Gomez Cleverly

## 2022-07-11 ENCOUNTER — Encounter (HOSPITAL_BASED_OUTPATIENT_CLINIC_OR_DEPARTMENT_OTHER)
Admission: RE | Disposition: A | Discharge status: Home / Self Care | Payer: Self-pay | Source: Home / Self Care | Attending: Orthopedic Surgery

## 2022-07-11 ENCOUNTER — Other Ambulatory Visit: Payer: Self-pay

## 2022-07-11 ENCOUNTER — Ambulatory Visit (HOSPITAL_BASED_OUTPATIENT_CLINIC_OR_DEPARTMENT_OTHER)
Admission: RE | Admit: 2022-07-11 | Discharge: 2022-07-11 | Disposition: A | Discharge status: Home / Self Care | Payer: 59 | Attending: Orthopedic Surgery | Admitting: Orthopedic Surgery

## 2022-07-11 ENCOUNTER — Ambulatory Visit (HOSPITAL_BASED_OUTPATIENT_CLINIC_OR_DEPARTMENT_OTHER): Payer: 59 | Admitting: Anesthesiology

## 2022-07-11 ENCOUNTER — Encounter (HOSPITAL_BASED_OUTPATIENT_CLINIC_OR_DEPARTMENT_OTHER): Payer: Self-pay | Admitting: Orthopedic Surgery

## 2022-07-11 DIAGNOSIS — F17200 Nicotine dependence, unspecified, uncomplicated: Secondary | ICD-10-CM | POA: Insufficient documentation

## 2022-07-11 DIAGNOSIS — S6991XA Unspecified injury of right wrist, hand and finger(s), initial encounter: Secondary | ICD-10-CM

## 2022-07-11 DIAGNOSIS — Z472 Encounter for removal of internal fixation device: Secondary | ICD-10-CM | POA: Insufficient documentation

## 2022-07-11 DIAGNOSIS — F17210 Nicotine dependence, cigarettes, uncomplicated: Secondary | ICD-10-CM

## 2022-07-11 DIAGNOSIS — S63004A Unspecified dislocation of right wrist and hand, initial encounter: Secondary | ICD-10-CM

## 2022-07-11 HISTORY — PX: EXTREMITY WIRE/PIN REMOVAL: SHX5051

## 2022-07-11 SURGERY — REMOVAL, PIN, EXTREMITY
Anesthesia: Monitor Anesthesia Care | Site: Wrist | Laterality: Right

## 2022-07-11 MED ORDER — PROPOFOL 500 MG/50ML IV EMUL
INTRAVENOUS | Status: DC | PRN
  Administered 2022-07-11: 100 ug/kg/min via INTRAVENOUS

## 2022-07-11 MED ORDER — OXYCODONE HCL 5 MG/5ML PO SOLN: 5.0000 mg | Freq: Once | ORAL | Status: AC | PRN

## 2022-07-11 MED ORDER — BUPIVACAINE HCL (PF) 0.5 % IJ SOLN
INTRAMUSCULAR | Status: AC
  Filled 2022-07-11: qty 30

## 2022-07-11 MED ORDER — PROPOFOL 500 MG/50ML IV EMUL
INTRAVENOUS | Status: AC
  Filled 2022-07-11: qty 50

## 2022-07-11 MED ORDER — FENTANYL CITRATE (PF) 100 MCG/2ML IJ SOLN
INTRAMUSCULAR | Status: DC | PRN
  Administered 2022-07-11: 100 ug via INTRAVENOUS

## 2022-07-11 MED ORDER — ONDANSETRON HCL 4 MG/2ML IJ SOLN
INTRAMUSCULAR | Status: AC
  Filled 2022-07-11: qty 2

## 2022-07-11 MED ORDER — OXYCODONE HCL 5 MG PO TABS
5.0000 mg | ORAL_TABLET | Freq: Once | ORAL | Status: AC | PRN
  Administered 2022-07-11: 5 mg via ORAL

## 2022-07-11 MED ORDER — OXYCODONE-ACETAMINOPHEN 5-325 MG PO TABS: 1.0000 | ORAL_TABLET | Freq: Four times a day (QID) | ORAL | 0 refills | Status: AC | PRN

## 2022-07-11 MED ORDER — BUPIVACAINE HCL 0.5 % IJ SOLN
INTRAMUSCULAR | Status: DC | PRN
  Administered 2022-07-11: 5 mL

## 2022-07-11 MED ORDER — PROPOFOL 10 MG/ML IV BOLUS
INTRAVENOUS | Status: DC | PRN
  Administered 2022-07-11 (×2): 40 mg via INTRAVENOUS

## 2022-07-11 MED ORDER — FENTANYL CITRATE (PF) 100 MCG/2ML IJ SOLN
INTRAMUSCULAR | Status: AC
  Filled 2022-07-11: qty 2

## 2022-07-11 MED ORDER — LIDOCAINE HCL (PF) 1 % IJ SOLN
INTRAMUSCULAR | Status: DC | PRN
  Administered 2022-07-11: 5 mL

## 2022-07-11 MED ORDER — CEFAZOLIN SODIUM-DEXTROSE 2-4 GM/100ML-% IV SOLN
INTRAVENOUS | Status: AC
  Filled 2022-07-11: qty 100

## 2022-07-11 MED ORDER — LIDOCAINE 2% (20 MG/ML) 5 ML SYRINGE
INTRAMUSCULAR | Status: DC | PRN
  Administered 2022-07-11: 60 mg via INTRAVENOUS

## 2022-07-11 MED ORDER — MIDAZOLAM HCL 2 MG/2ML IJ SOLN
INTRAMUSCULAR | Status: AC
  Filled 2022-07-11: qty 2

## 2022-07-11 MED ORDER — FENTANYL CITRATE (PF) 100 MCG/2ML IJ SOLN
25.0000 ug | INTRAMUSCULAR | Status: DC | PRN
  Administered 2022-07-11 (×4): 25 ug via INTRAVENOUS

## 2022-07-11 MED ORDER — KETOROLAC TROMETHAMINE 30 MG/ML IJ SOLN
INTRAMUSCULAR | Status: AC
  Filled 2022-07-11: qty 1

## 2022-07-11 MED ORDER — LIDOCAINE HCL (PF) 2 % IJ SOLN
INTRAMUSCULAR | Status: AC
  Filled 2022-07-11: qty 5

## 2022-07-11 MED ORDER — CEFAZOLIN SODIUM-DEXTROSE 2-4 GM/100ML-% IV SOLN
2.0000 g | INTRAVENOUS | Status: AC
  Administered 2022-07-11: 2 g via INTRAVENOUS

## 2022-07-11 MED ORDER — ONDANSETRON HCL 4 MG/2ML IJ SOLN
INTRAMUSCULAR | Status: DC | PRN
  Administered 2022-07-11: 4 mg via INTRAVENOUS

## 2022-07-11 MED ORDER — LACTATED RINGERS IV SOLN
INTRAVENOUS | Status: DC
Start: 2022-07-11 — End: 2022-07-11

## 2022-07-11 MED ORDER — OXYCODONE HCL 5 MG PO TABS
ORAL_TABLET | ORAL | Status: AC
  Filled 2022-07-11: qty 1

## 2022-07-11 MED ORDER — KETOROLAC TROMETHAMINE 30 MG/ML IJ SOLN
30.0000 mg | Freq: Once | INTRAMUSCULAR | Status: AC | PRN
  Administered 2022-07-11: 30 mg via INTRAVENOUS

## 2022-07-11 MED ORDER — LIDOCAINE HCL 1 % IJ SOLN
INTRAMUSCULAR | Status: AC
  Filled 2022-07-11: qty 20

## 2022-07-11 MED ORDER — MIDAZOLAM HCL 5 MG/5ML IJ SOLN
INTRAMUSCULAR | Status: DC | PRN
  Administered 2022-07-11: 2 mg via INTRAVENOUS

## 2022-07-11 SURGICAL SUPPLY — 48 items
APL PRP STRL LF DISP 70% ISPRP (MISCELLANEOUS) ×1
BAG COUNTER SPONGE SURGICOUNT (BAG) IMPLANT
BAG SPEC THK2 15X12 ZIP CLS (MISCELLANEOUS) ×1
BAG SPNG CNTER NS LX DISP (BAG)
BAG ZIPLOCK 12X15 (MISCELLANEOUS) ×2 IMPLANT
BLADE OSCILLATING/SAGITTAL (BLADE) ×2
BLADE SW THK.38XMED LNG THN (BLADE) ×1 IMPLANT
BNDG GAUZE DERMACEA FLUFF (GAUZE/BANDAGES/DRESSINGS) ×1
BNDG GAUZE DERMACEA FLUFF 4 (GAUZE/BANDAGES/DRESSINGS) ×1 IMPLANT
BNDG GZE DERMACEA 4 6PLY (GAUZE/BANDAGES/DRESSINGS) ×1
CHLORAPREP W/TINT 26 (MISCELLANEOUS) ×2 IMPLANT
CORD BIPOLAR FORCEPS 12FT (ELECTRODE) ×4 IMPLANT
COVER BACK TABLE 60X90IN (DRAPES) ×2 IMPLANT
CUFF TOURN SGL QUICK 34 (TOURNIQUET CUFF) ×2
CUFF TRNQT CYL 34X4.125X (TOURNIQUET CUFF) ×1 IMPLANT
DRAPE C-ARM 42X120 X-RAY (DRAPES) ×2 IMPLANT
DRAPE OEC MINIVIEW 54X84 (DRAPES) ×2 IMPLANT
DRAPE SHEET LG 3/4 BI-LAMINATE (DRAPES) ×2 IMPLANT
DRAPE U-SHAPE 47X51 STRL (DRAPES) ×2 IMPLANT
DRSG ADAPTIC 3X8 NADH LF (GAUZE/BANDAGES/DRESSINGS) ×2 IMPLANT
DRSG PAD ABDOMINAL 8X10 ST (GAUZE/BANDAGES/DRESSINGS) ×4 IMPLANT
ELECT REM PT RETURN 15FT ADLT (MISCELLANEOUS) ×2 IMPLANT
GAUZE SPONGE 4X4 12PLY STRL (GAUZE/BANDAGES/DRESSINGS) ×4 IMPLANT
GLOVE BIO SURGEON STRL SZ7.5 (GLOVE) ×4 IMPLANT
GLOVE BIO SURGEON STRL SZ8 (GLOVE) ×2 IMPLANT
GLOVE BIOGEL PI IND STRL 7.5 (GLOVE) ×1 IMPLANT
GLOVE BIOGEL PI INDICATOR 7.5 (GLOVE) ×1
KIT TURNOVER CYSTO (KITS) IMPLANT
MANIFOLD NEPTUNE II (INSTRUMENTS) ×2 IMPLANT
NEEDLE HYPO 22GX1.5 SAFETY (NEEDLE) ×2 IMPLANT
PACK BASIN DAY SURGERY FS (CUSTOM PROCEDURE TRAY) ×2 IMPLANT
PAD CAST 4YDX4 CTTN HI CHSV (CAST SUPPLIES) ×2 IMPLANT
PADDING CAST COTTON 4X4 STRL (CAST SUPPLIES) ×4
PENCIL SMOKE EVACUATOR (MISCELLANEOUS) IMPLANT
PROTECTOR NERVE ULNAR (MISCELLANEOUS) ×2 IMPLANT
SUT ETHILON 3 0 PS 1 (SUTURE) ×2 IMPLANT
SUT FIBERWIRE #2 38 T-5 BLUE (SUTURE) ×2
SUT FIBERWIRE 2-0 18 17.9 3/8 (SUTURE) ×2
SUT MERSILENE 4 0 P 3 (SUTURE) ×2 IMPLANT
SUT PROLENE 3 0 PS 2 (SUTURE) ×2 IMPLANT
SUT SILK 2 0 (SUTURE) ×2
SUT SILK 2-0 18XBRD TIE 12 (SUTURE) ×1 IMPLANT
SUT VIC AB 2-0 CT1 27 (SUTURE) ×2
SUT VIC AB 2-0 CT1 TAPERPNT 27 (SUTURE) ×1 IMPLANT
SUTURE FIBERWR #2 38 T-5 BLUE (SUTURE) ×1 IMPLANT
SUTURE FIBERWR 2-0 18 17.9 3/8 (SUTURE) ×1 IMPLANT
SYR CONTROL 10ML LL (SYRINGE) ×2 IMPLANT
TOWEL OR 17X26 10 PK STRL BLUE (TOWEL DISPOSABLE) ×2 IMPLANT

## 2022-07-11 NOTE — Transfer of Care (Signed)
Immediate Anesthesia Transfer of Care Note  Patient: Sergio Boyd  Procedure(s) Performed: Right wrist removal of deep buried pins x 7 (Right: Wrist)  Patient Location: PACU  Anesthesia Type:MAC  Level of Consciousness: awake, alert  and oriented  Airway & Oxygen Therapy: Patient Spontanous Breathing and Patient connected to face mask oxygen  Post-op Assessment: Report given to RN and Post -op Vital signs reviewed and stable  Post vital signs: Reviewed and stable  Last Vitals:  Vitals Value Taken Time  BP 183/110 07/11/22 1127  Temp    Pulse 43 07/11/22 1128  Resp 14 07/11/22 1128  SpO2 100 % 07/11/22 1128  Vitals shown include unvalidated device data.  Last Pain:  Vitals:   07/11/22 0920  TempSrc: Oral  PainSc: 10-Worst pain ever      Patients Stated Pain Goal: 5 (24/46/28 6381)  Complications: No notable events documented.

## 2022-07-11 NOTE — Op Note (Signed)
OPERATIVE NOTE  DATE OF PROCEDURE: 07/11/2022  SURGEONS:  Primary: Orene Desanctis, MD  PREOPERATIVE DIAGNOSIS: Right wrist lunate dislocation s/p ORIF  POSTOPERATIVE DIAGNOSIS: Same  NAME OF PROCEDURE:   Right wrist removal of deep buried pins  ANESTHESIA: Monitor Anesthesia Care + Local  SKIN PREPARATION: Hibiclens  ESTIMATED BLOOD LOSS: Minimal  IMPLANTS: none EXPLANTS: k wires x 7  INDICATIONS:  Sergio Boyd is a 51 y.o. male who has the above preoperative diagnosis. The patient has decided to proceed with surgical intervention.  Risks, benefits and alternatives of operative management were discussed including, but not limited to, risks of anesthesia complications, infection, pain, persistent symptoms, stiffness, need for future surgery.  The patient understands, agrees and elects to proceed with surgery.    DESCRIPTION OF PROCEDURE: The patient was met in the pre-operative area and their identity was verified.  The operative location and laterality was also verified and marked.  The patient was brought to the OR and was placed supine on the table.  After repeat patient identification with the operative team anesthesia was provided and the patient was prepped and draped in the usual sterile fashion.  A final timeout was performed verifying the correction patient, procedure, location and laterality.  The right upper extremity was elevated exsanguinated with an Esmarch and tourniquet inflated to 250 mmHg.  A longitudinal incision was made over the radial and ulnar aspects of the wrist.  Skin and subcutaneous tissues were divided.  Care was taken to protect the sensory nerve branches and tendons.  The pin ends were identified and were carefully removed with a needle driver.  All 7 pins were removed with tips intact.  C-arm fluoroscopy was utilized to confirm complete removal of hardware and stable alignment of the carpus.  The wounds were thoroughly irrigated and closed with 4-0 nylon suture.  A  sterile soft bandage was applied.  The tourniquet was deflated and the fingers were pink and warm and well-perfused at the end of the procedure.  All counts were correct x2.   Matt Holmes, MD

## 2022-07-11 NOTE — Interval H&P Note (Signed)
History and Physical Interval Note:  07/11/2022 9:45 AM  Sergio Boyd  has presented today for surgery, with the diagnosis of Right wrist lunate dislocation s/p ORIF.  The various methods of treatment have been discussed with the patient and family. After consideration of risks, benefits and other options for treatment, the patient has consented to  Procedure(s) with comments: Right wrist removal of deep buried pins x 7 (Right) - with local anesthesia as a surgical intervention.  The patient's history has been reviewed, patient examined, no change in status, stable for surgery.  I have reviewed the patient's chart and labs.  Questions were answered to the patient's satisfaction.     Gomez Cleverly

## 2022-07-11 NOTE — Discharge Instructions (Addendum)
Orthopaedic Hand Surgery Discharge Instructions  WEIGHT BEARING STATUS: Non weight bearing on operative extremity  INCISION CARE: Keep dressing over your incision clean and dry until your therapy appointment after surgery. You may shower by placing a waterproof covering over your dressing. Once dressing is removed, you may allow water to run over the incision and then place Band-Aids over incision. Do not scrub your incision or apply creams/lotions. Do not submerge your incision or swim for 3 weeks after surgery. Contact your surgeon or primary care doctor if you develop redness or drainage from your incision.   PAIN CONTROL: First line medications for post operative pain control are Tylenol (acetaminophen) and Motrin (ibuprofen) if you are able to take these medications. If you have been prescribed a medication these can be taken as breakthrough pain medications. Please note that some narcotic pain medication has acetaminophen added and you should never consume more than 4,000mg  of acetaminophen in 24-hour period. Please note that if you are given Toradol (ketorolac) you should not take similar medications such as ibuprofen or naproxen.  DISCHARGE MEDICATIONS: If you have been prescribed medication it was sent electronically to your pharmacy. No changes have been made to your home medications.  ICE/ELEVATION: Ice and elevate your injured extremity as needed. Avoid direct contact of ice with skin.   BANDAGE FEELS TOO TIGHT: If your bandage feels too tight, first make sure you are elevating your fingers as much as possible. The outer layer of the bandage can be unwrapped and reapplied more loosely. If no improvement, you may carefully cut the inner layer longitudinally until the pressure has resolved and then rewrap the outer layer. If you are not comfortable with these instructions, please call the office and the bandage can be changed for you.   FOLLOW UP: You will be called after surgery with an  appointment date and time, however if you have not received a phone call within 3 days, please call during regular office hours at [phone] to schedule a post operative appointment.  Please Seek Medical Attention if: Call MD for: pain or pressure in chest, jaw, arm, back, neck  Call MD for: temperature greater than 101 F for more than 24 hrs Call MD for: difficulty breathing Call MD for: incision redness, bleeding, drainage  Call MD for: palpitations or feeling that the heart is racing  Call MD for: increased swelling in arm, leg, ankle, or abdomen  Call MD for: lightheadedness, dizziness, fainting Call 911 or go to ER for any medical emergency if you are not able to get in touch with your doctor   J. Standley Dakins, MD Orthopaedic Hand Surgeon [facility] Office number: [phone] [address], [address] [place], [place] [address]    Post Anesthesia Home Care Instructions  Activity: Get plenty of rest for the remainder of the day. A responsible individual must stay with you for 24 hours following the procedure.  For the next 24 hours, DO NOT: -Drive a car -Advertising copywriter -Drink alcoholic beverages -Take any medication unless instructed by your physician -Make any legal decisions or sign important papers.  Meals: Start with liquid foods such as gelatin or soup. Progress to regular foods as tolerated. Avoid greasy, spicy, heavy foods. If nausea and/or vomiting occur, drink only clear liquids until the nausea and/or vomiting subsides. Call your physician if vomiting continues.  Special Instructions/Symptoms: Your throat may feel dry or sore from the anesthesia or the breathing tube placed in your throat during surgery. If this causes discomfort, gargle  with warm salt water. The discomfort should disappear within 24 hours.

## 2022-07-11 NOTE — Anesthesia Preprocedure Evaluation (Addendum)
Anesthesia Evaluation  Patient identified by MRN, date of birth, ID band Patient awake    Reviewed: Allergy & Precautions, NPO status , Patient's Chart, lab work & pertinent test results  Airway Mallampati: I  TM Distance: >3 FB Neck ROM: Full    Dental no notable dental hx. (+) Teeth Intact, Dental Advisory Given   Pulmonary neg pulmonary ROS, Current Smoker,    Pulmonary exam normal breath sounds clear to auscultation       Cardiovascular negative cardio ROS Normal cardiovascular exam Rhythm:Regular Rate:Normal     Neuro/Psych  Headaches, negative psych ROS   GI/Hepatic Neg liver ROS, GERD  ,  Endo/Other  negative endocrine ROS  Renal/GU negative Renal ROS  negative genitourinary   Musculoskeletal negative musculoskeletal ROS (+)   Abdominal   Peds  Hematology negative hematology ROS (+)   Anesthesia Other Findings   Reproductive/Obstetrics                            Anesthesia Physical Anesthesia Plan  ASA: 2  Anesthesia Plan: MAC   Post-op Pain Management: Tylenol PO (pre-op)*   Induction: Intravenous  PONV Risk Score and Plan: 1 and Propofol infusion, Treatment may vary due to age or medical condition, Midazolam, Dexamethasone and Ondansetron  Airway Management Planned: Natural Airway  Additional Equipment:   Intra-op Plan:   Post-operative Plan:   Informed Consent: I have reviewed the patients History and Physical, chart, labs and discussed the procedure including the risks, benefits and alternatives for the proposed anesthesia with the patient or authorized representative who has indicated his/her understanding and acceptance.     Dental advisory given  Plan Discussed with: CRNA  Anesthesia Plan Comments:         Anesthesia Quick Evaluation

## 2022-07-12 ENCOUNTER — Encounter (HOSPITAL_BASED_OUTPATIENT_CLINIC_OR_DEPARTMENT_OTHER): Payer: Self-pay | Admitting: Orthopedic Surgery

## 2022-07-12 NOTE — Anesthesia Postprocedure Evaluation (Signed)
Anesthesia Post Note  Patient: Sergio Boyd  Procedure(s) Performed: Right wrist removal of deep buried pins x 7 (Right: Wrist)     Patient location during evaluation: PACU Anesthesia Type: MAC Level of consciousness: awake and alert Pain management: pain level controlled Vital Signs Assessment: post-procedure vital signs reviewed and stable Respiratory status: spontaneous breathing, nonlabored ventilation, respiratory function stable and patient connected to nasal cannula oxygen Cardiovascular status: blood pressure returned to baseline and stable Postop Assessment: no apparent nausea or vomiting Anesthetic complications: no   No notable events documented.  Last Vitals:  Vitals:   07/11/22 1230 07/11/22 1321  BP: (!) 167/88 (!) 151/99  Pulse: (!) 42 (!) 39  Resp: 12 12  Temp: 36.7 C (!) 36.2 C  SpO2: 98% 99%    Last Pain:  Vitals:   07/11/22 1321  TempSrc:   PainSc: 6                  Arjun Hard L Brylei Pedley

## 2023-05-14 ENCOUNTER — Other Ambulatory Visit: Payer: Self-pay

## 2023-05-14 ENCOUNTER — Emergency Department (HOSPITAL_BASED_OUTPATIENT_CLINIC_OR_DEPARTMENT_OTHER): Payer: 59 | Admitting: Radiology

## 2023-05-14 ENCOUNTER — Encounter (HOSPITAL_BASED_OUTPATIENT_CLINIC_OR_DEPARTMENT_OTHER): Payer: Self-pay

## 2023-05-14 DIAGNOSIS — M62838 Other muscle spasm: Secondary | ICD-10-CM | POA: Insufficient documentation

## 2023-05-14 DIAGNOSIS — R079 Chest pain, unspecified: Secondary | ICD-10-CM | POA: Diagnosis not present

## 2023-05-14 DIAGNOSIS — M25511 Pain in right shoulder: Secondary | ICD-10-CM | POA: Diagnosis present

## 2023-05-14 LAB — CBC
HCT: 39.1 % (ref 39.0–52.0)
Hemoglobin: 13 g/dL (ref 13.0–17.0)
MCH: 29.8 pg (ref 26.0–34.0)
MCHC: 33.2 g/dL (ref 30.0–36.0)
MCV: 89.7 fL (ref 80.0–100.0)
Platelets: 255 10*3/uL (ref 150–400)
RBC: 4.36 MIL/uL (ref 4.22–5.81)
RDW: 15.1 % (ref 11.5–15.5)
WBC: 5.5 10*3/uL (ref 4.0–10.5)
nRBC: 0 % (ref 0.0–0.2)

## 2023-05-14 LAB — BASIC METABOLIC PANEL
Anion gap: 9 (ref 5–15)
BUN: 12 mg/dL (ref 6–20)
CO2: 28 mmol/L (ref 22–32)
Calcium: 9.6 mg/dL (ref 8.9–10.3)
Chloride: 103 mmol/L (ref 98–111)
Creatinine, Ser: 0.92 mg/dL (ref 0.61–1.24)
GFR, Estimated: >60 mL/min (ref >60)
Glucose, Bld: 85 mg/dL (ref 70–99)
Potassium: 3.5 mmol/L (ref 3.5–5.1)
Sodium: 140 mmol/L (ref 135–145)

## 2023-05-14 LAB — TROPONIN I (HIGH SENSITIVITY): Troponin I (High Sensitivity): 4 ng/L (ref <18)

## 2023-05-14 NOTE — ED Triage Notes (Signed)
Patient here POV from Home.  Endorses CP for about 3 Days. Right Chest and Radiates through to Right Upper Back. No SOB. Painful with Deep Inspiration  NAD Noted during Triage. A&Ox4. Gcs 15. Ambulatory.

## 2023-05-15 ENCOUNTER — Emergency Department (HOSPITAL_BASED_OUTPATIENT_CLINIC_OR_DEPARTMENT_OTHER)
Admission: EM | Admit: 2023-05-15 | Discharge: 2023-05-15 | Disposition: A | Discharge status: Home / Self Care | Payer: 59 | Attending: Emergency Medicine | Admitting: Emergency Medicine

## 2023-05-15 ENCOUNTER — Telehealth (HOSPITAL_BASED_OUTPATIENT_CLINIC_OR_DEPARTMENT_OTHER): Payer: Self-pay | Admitting: Emergency Medicine

## 2023-05-15 DIAGNOSIS — M62830 Muscle spasm of back: Secondary | ICD-10-CM

## 2023-05-15 LAB — TROPONIN I (HIGH SENSITIVITY): Troponin I (High Sensitivity): 3 ng/L (ref <18)

## 2023-05-15 MED ORDER — IBUPROFEN 800 MG PO TABS: 800.0000 mg | ORAL_TABLET | Freq: Three times a day (TID) | ORAL | 0 refills | Status: AC | PRN

## 2023-05-15 MED ORDER — KETOROLAC TROMETHAMINE 30 MG/ML IJ SOLN
30.0000 mg | Freq: Once | INTRAMUSCULAR | Status: AC
  Administered 2023-05-15: 30 mg via INTRAVENOUS
  Filled 2023-05-15: qty 1

## 2023-05-15 MED ORDER — METHOCARBAMOL 500 MG PO TABS: 500.0000 mg | ORAL_TABLET | Freq: Every day | ORAL | 1 refills | Status: AC

## 2023-05-15 MED ORDER — LIDOCAINE 5 % EX PTCH: 1.0000 | MEDICATED_PATCH | CUTANEOUS | 0 refills | Status: AC

## 2023-05-15 NOTE — ED Provider Notes (Signed)
[place] EMERGENCY DEPARTMENT AT [facility]  Provider Note  CSN: [id number] Arrival date & time: 05/14/23 2134  History Chief Complaint  Patient presents with   Chest Pain    Sergio Boyd is a 52 y.o. male with no significant PMH reports 3 days of R shoulder and upper chest pain. Worse with movement and deep breath. No recent falls or injuries. No SOB or fever. Thought it might be GERD. 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 Pain radiates down his R arm.    Home Medications Prior to Admission medications   Medication Sig Start Date End Date Taking? Authorizing Provider  ibuprofen (ADVIL) 800 MG tablet Take 1 tablet (800 mg total) by mouth every 8 (eight) hours as needed for moderate pain. 05/15/23   Pollyann Savoy, MD  lidocaine (LIDODERM) 5 % Place 1 patch onto the skin daily. Remove & Discard patch within 12 hours or as directed by MD 05/15/23   Pollyann Savoy, MD  methocarbamol (ROBAXIN) 500 MG tablet Take 1 tablet (500 mg total) by mouth at bedtime. 05/15/23   Pollyann Savoy, MD     Allergies    Chloraseptic sore throat [acetaminophen] and Pork-derived products   Review of Systems   Review of Systems Please see HPI for pertinent positives and negatives  Physical Exam BP 135/85   Pulse (!) 48   Temp 98 F (36.7 C)   Resp (!) 21   Ht 5\' 10"  (1.778 m)   Wt 95.3 kg   SpO2 100%   BMI 30.13 kg/m   Physical Exam Vitals and nursing note reviewed.  Constitutional:      Appearance: Normal appearance.  HENT:     Head: Normocephalic and atraumatic.     Nose: Nose normal.     Mouth/Throat:     Mouth: Mucous membranes are moist.  Eyes:     Extraocular Movements: Extraocular movements intact.     Conjunctiva/sclera: Conjunctivae normal.  Cardiovascular:     Rate and Rhythm: Normal rate.  Pulmonary:     Effort: Pulmonary effort is normal.     Breath sounds: Normal breath sounds.  Chest:     Chest wall: Tenderness present.  Abdominal:      General: Abdomen is flat.     Palpations: Abdomen is soft.     Tenderness: There is no abdominal tenderness.  Musculoskeletal:        General: Tenderness (R trapezius and R upper chest) present. No swelling. Normal range of motion.     Cervical back: Neck supple.  Skin:    General: Skin is warm and dry.  Neurological:     General: No focal deficit present.     Mental Status: He is alert.  Psychiatric:        Mood and Affect: Mood normal.     ED Results / Procedures / Treatments   EKG EKG Interpretation  Date/Time:  Monday May 14 2023 21:47:21 EDT Ventricular Rate:  55 PR Interval:  146 QRS Duration: 82 QT Interval:  386 QTC Calculation: 369 R Axis:   69 Text Interpretation: Sinus bradycardia T wave abnormality, consider inferior ischemia Abnormal ECG When compared with ECG of 24-Feb-2019 12:37, PREVIOUS ECG IS PRESENT Confirmed by Ernie Avena ([id number]) on 05/14/2023 10:32:18 PM  Procedures Procedures  Medications Ordered in the ED Medications  ketorolac (TORADOL) 30 MG/ML injection 30 mg (30 mg Intravenous Given 05/15/23 0159)    Initial Impression and Plan  Patient here with  MSK R upper chest and shoulder pain. Some radicular symptoms. Labs done in triage show normal CBC, BMP and Trop x 2. I personally viewed the images from radiology studies and agree with radiologist interpretation: CXR is clear. EKG is brady but otherwise unremarkable. Plan treatment for MSK pain with NSAIDs, muscle relaxer and lidocaine patch. Recommend rest and heat. PCP follow up, RTED for any other concerns.    ED Course       MDM Rules/Calculators/A&P Medical Decision Making Problems Addressed: Spasm of right trapezius muscle: acute illness or injury  Amount and/or Complexity of Data Reviewed Labs: ordered. Decision-making details documented in ED Course. Radiology: ordered and independent interpretation performed. Decision-making details documented in ED Course. ECG/medicine tests: ordered  and independent interpretation performed. Decision-making details documented in ED Course.  Risk Prescription drug management.     Final Clinical Impression(s) / ED Diagnoses Final diagnoses:  Spasm of right trapezius muscle    Rx / DC Orders ED Discharge Orders          Ordered    lidocaine (LIDODERM) 5 %  Every 24 hours        05/15/23 0203    methocarbamol (ROBAXIN) 500 MG tablet  Daily at bedtime        05/15/23 0203    ibuprofen (ADVIL) 800 MG tablet  Every 8 hours PRN        05/15/23 0203             Pollyann Savoy, MD 05/15/23 [phone]

## 2024-04-19 ENCOUNTER — Emergency Department (HOSPITAL_BASED_OUTPATIENT_CLINIC_OR_DEPARTMENT_OTHER): Admitting: Radiology

## 2024-04-19 ENCOUNTER — Encounter (HOSPITAL_BASED_OUTPATIENT_CLINIC_OR_DEPARTMENT_OTHER): Payer: Self-pay

## 2024-04-19 ENCOUNTER — Emergency Department (HOSPITAL_BASED_OUTPATIENT_CLINIC_OR_DEPARTMENT_OTHER)
Admission: EM | Admit: 2024-04-19 | Discharge: 2024-04-20 | Disposition: A | Discharge status: Home / Self Care | Attending: Emergency Medicine | Admitting: Emergency Medicine

## 2024-04-19 DIAGNOSIS — Z87891 Personal history of nicotine dependence: Secondary | ICD-10-CM | POA: Diagnosis not present

## 2024-04-19 DIAGNOSIS — R079 Chest pain, unspecified: Secondary | ICD-10-CM | POA: Diagnosis present

## 2024-04-19 DIAGNOSIS — M546 Pain in thoracic spine: Secondary | ICD-10-CM | POA: Diagnosis not present

## 2024-04-19 LAB — BASIC METABOLIC PANEL WITH GFR
Anion gap: 10 (ref 5–15)
BUN: 11 mg/dL (ref 6–20)
CO2: 25 mmol/L (ref 22–32)
Calcium: 9.4 mg/dL (ref 8.9–10.3)
Chloride: 102 mmol/L (ref 98–111)
Creatinine, Ser: 0.89 mg/dL (ref 0.61–1.24)
GFR, Estimated: >60 mL/min (ref >60)
Glucose, Bld: 96 mg/dL (ref 70–99)
Potassium: 3.8 mmol/L (ref 3.5–5.1)
Sodium: 138 mmol/L (ref 135–145)

## 2024-04-19 LAB — CBC
HCT: 36.9 % — ABNORMAL LOW (ref 39.0–52.0)
Hemoglobin: 12.3 g/dL — ABNORMAL LOW (ref 13.0–17.0)
MCH: 29.5 pg (ref 26.0–34.0)
MCHC: 33.3 g/dL (ref 30.0–36.0)
MCV: 88.5 fL (ref 80.0–100.0)
Platelets: 215 10*3/uL (ref 150–400)
RBC: 4.17 MIL/uL — ABNORMAL LOW (ref 4.22–5.81)
RDW: 14.9 % (ref 11.5–15.5)
WBC: 5.6 10*3/uL (ref 4.0–10.5)
nRBC: 0 % (ref 0.0–0.2)

## 2024-04-19 LAB — TROPONIN T, HIGH SENSITIVITY: Troponin T High Sensitivity: <15 ng/L (ref <19)

## 2024-04-19 NOTE — ED Triage Notes (Signed)
 Patient is having sharp pain radiating to his back with numbness in his right arm. Chest pain is on the right side. Chest pain started a  few days ago. States the pain is worse. Rates chest pain 10/10. Patient took ibuprofen  a few hours ago. No blood thinners.

## 2024-04-20 ENCOUNTER — Emergency Department (HOSPITAL_BASED_OUTPATIENT_CLINIC_OR_DEPARTMENT_OTHER)

## 2024-04-20 LAB — TROPONIN T, HIGH SENSITIVITY: Troponin T High Sensitivity: <15 ng/L (ref <19)

## 2024-04-20 MED ORDER — MORPHINE SULFATE (PF) 4 MG/ML IV SOLN
4.0000 mg | Freq: Once | INTRAVENOUS | Status: AC
  Administered 2024-04-20: 4 mg via INTRAVENOUS
  Filled 2024-04-20: qty 1

## 2024-04-20 MED ORDER — NAPROXEN 500 MG PO TABS: 500.0000 mg | ORAL_TABLET | Freq: Two times a day (BID) | ORAL | 0 refills | Status: AC

## 2024-04-20 MED ORDER — IOHEXOL 350 MG/ML SOLN
100.0000 mL | Freq: Once | INTRAVENOUS | Status: AC | PRN
  Administered 2024-04-20: 100 mL via INTRAVENOUS

## 2024-04-20 MED ORDER — CYCLOBENZAPRINE HCL 10 MG PO TABS: 10.0000 mg | ORAL_TABLET | Freq: Two times a day (BID) | ORAL | 0 refills | Status: AC | PRN

## 2024-04-20 NOTE — ED Provider Notes (Signed)
 [facility]GENCY [facility] Emergency Department Provider Note MRN:  [id number]  Arrival date & time: 04/20/24     Chief Complaint   Chest Pain   History of Present Illness   Sergio Boyd is a 53 y.o. year-old male with a history of GERD presenting to the ED with chief complaint of chest pain.  Right-sided chest pain with radiation to the mid thoracic back, feeling numb in the right arm.  Acute onset this evening.  Review of Systems  A thorough review of systems was obtained and all systems are negative except as noted in the HPI and PMH.   Patient's Health History    Past Medical History:  Diagnosis Date   Arthritis    Chronic back pain    herniated disc   GERD (gastroesophageal reflux disease)    Headache    History of migraine    Joint pain     Past Surgical History:  Procedure Laterality Date   EXTREMITY WIRE/PIN REMOVAL Right 07/11/2022   Procedure: Right wrist removal of deep buried pins x 7;  Surgeon: Ltanya Rummer, MD;  Location: [facility];  Service: Orthopedics;  Laterality: Right;  with local anesthesia   INGUINAL HERNIA REPAIR Right    INGUINAL HERNIA REPAIR Right 11/23/2015   Procedure: RIGHT INGUINAL HERNIA REPAIR W/MESH;  Surgeon: Adalberto Hollow, MD;  Location: [facility] OR;  Service: General;  Laterality: Right;   INSERTION OF MESH Right 11/23/2015   Procedure: INSERTION OF MESH;  Surgeon: Adalberto Hollow, MD;  Location: [facility] OR;  Service: General;  Laterality: Right;   KNEE SURGERY     right ACL 2012   LUMBAR LAMINECTOMY/DECOMPRESSION MICRODISCECTOMY N/A 04/20/2017   Procedure: Re Operative Right Lumbar five-Sacral one Laminectomy/Recurrent discectomy, Left Lumbar four-five Laminectomy for decompression, pseudomeningocele repair;  Surgeon: Ditty, Raelene Bullocks, MD;  Location: [facility] OR;  Service: Neurosurgery;  Laterality: N/A;   LUMBAR SPINE SURGERY  02/2017   Dr. Tharon Finder   LUMBAR WOUND DEBRIDEMENT N/A 04/24/2017   Procedure: Repair of  Pseudomeningocele and Placement of Lumbar Drain;  Surgeon: Ditty, Raelene Bullocks, MD;  Location[facility] OR;  Service: Neurosurgery;  Laterality: N/A;   ORIF WRIST FRACTURE Right 05/13/2022   Procedure: OPEN REDUCTION INTERNAL FIXATION (ORIF) WRIST FRACTURE;  Surgeon: Ltanya Rummer, MD;  Location: [facility] OR;  Service: Orthopedics;  Laterality: Right;   PLACEMENT OF LUMBAR DRAIN N/A 04/24/2017   Procedure: PLACEMENT OF LUMBAR DRAIN;  Surgeon: Ditty, Raelene Bullocks, MD;  Location: [facility] OR;  Service: Neurosurgery;  Laterality: N/A;    History reviewed. No pertinent family history.  Social History   Socioeconomic History   Marital status: Single    Spouse name: Not on file   Number of children: Not on file   Years of education: Not on file   Highest education level: Not on file  Occupational History   Not on file  Tobacco Use   Smoking status: Former    Current packs/day: 0.50    Average packs/day: 0.5 packs/day for 10.0 years (5.0 ttl pk-yrs)    Types: Cigarettes   Smokeless tobacco: Never  Vaping Use   Vaping status: Never Used  Substance and Sexual Activity   Alcohol use: Yes    Comment: Occ   Drug use: No   Sexual activity: Not on file  Other Topics Concern   Not on file  Social History Narrative   Lives alone, works at [employer].  Exercise - active on the job.  Social Drivers of Corporate investment banker Strain: Not on file  Food Insecurity: Not on file  Transportation Needs: Not on file  Physical Activity: Not on file  Stress: Not on file  Social Connections: Unknown (04/24/2022)   Received from [facility]   Social Network    Social Network: Not on file  Intimate Partner Violence: Unknown (03/16/2022)   Received from [facility], [facility]   HITS    Physically Hurt: Not on file    Insult or Talk Down To: Not on file    Threaten Physical Harm: Not on file    Scream or Curse: Not on file     Physical Exam   Vitals:   04/20/24  0230 04/20/24 0245  BP: (!) 154/81 (!) 141/94  Pulse: (!) 44 (!) 57  Resp: 12 13  Temp:    SpO2: 98% 96%    CONSTITUTIONAL: Well-appearing, NAD NEURO/PSYCH:  Alert and oriented x 3, no focal deficits EYES:  eyes equal and reactive ENT/NECK:  no LAD, no JVD CARDIO: Regular rate, well-perfused, normal S1 and S2 PULM:  CTAB no wheezing or rhonchi GI/GU:  non-distended, non-tender MSK/SPINE:  No gross deformities, no edema SKIN:  no rash, atraumatic   *Additional and/or pertinent findings included in MDM below  Diagnostic and Interventional Summary    EKG Interpretation Date/Time:  Saturday Apr 19 2024 22:44:45 EDT Ventricular Rate:  46 PR Interval:  152 QRS Duration:  92 QT Interval:  430 QTC Calculation: 376 R Axis:   82  Text Interpretation: Sinus bradycardia Nonspecific T wave abnormality Abnormal ECG When compared with ECG of 14-May-2023 21:47, No significant change was found Confirmed by Gwenetta Lennert [phone]) on 04/19/2024 11:00:24 PM       Labs Reviewed  CBC - Abnormal; Notable for the following components:      Result Value   RBC 4.17 (*)    Hemoglobin 12.3 (*)    HCT 36.9 (*)    All other components within normal limits  BASIC METABOLIC PANEL WITH GFR  TROPONIN T, HIGH SENSITIVITY  TROPONIN T, HIGH SENSITIVITY    CT Angio Chest/Abd/Pel for Dissection W and/or Wo Contrast  Final Result    DG Chest 2 View  Final Result      Medications  morphine  (PF) 4 MG/ML injection 4 mg (4 mg Intravenous Given 04/20/24 0117)  iohexol  (OMNIPAQUE ) 350 MG/ML injection 100 mL (100 mLs Intravenous Contrast Given 04/20/24 0133)  morphine  (PF) 4 MG/ML injection 4 mg (4 mg Intravenous Given 04/20/24 0214)     Procedures  /  Critical Care Procedures  ED Course and Medical Decision Making  Initial Impression and Ddx Differential diagnosis includes MSK pain, aortic dissection, ACS, pulmonary embolism.  Past medical/surgical history that increases complexity of ED encounter:  GERD  Interpretation of Diagnostics I personally reviewed the EKG and my interpretation is as follows: Sinus bradycardia  No significant blood count or electrolyte disturbance.  Troponin negative x 2.  CTA is unremarkable.  Patient Reassessment and Ultimate Disposition/Management     Patient well-appearing on reassessment, has a symmetric and normal neurological exam, paresthesia to the right arm is resolved, does not have asymmetric sensation currently.  Suspect MSK, appropriate for discharge.  Patient management required discussion with the following services or consulting groups:  None  Complexity of Problems Addressed Acute illness or injury that poses threat of life of bodily function  Additional Data Reviewed and Analyzed Further history obtained from: Prior labs/imaging results  Additional Factors Impacting ED Encounter Risk Use of parenteral controlled substances and Consideration of hospitalization  Merrick Abe. Harless Lien, MD [facility] Emergency Medicine [facility] [email or web address]  Final Clinical Impressions(s) / ED Diagnoses     ICD-10-CM   1. Chest pain, unspecified type  R07.9       ED Discharge Orders          Ordered    cyclobenzaprine  (FLEXERIL ) 10 MG tablet  2 times daily PRN        04/20/24 0309    naproxen (NAPROSYN) 500 MG tablet  2 times daily        04/20/24 0309             Discharge Instructions Discussed with and Provided to Patient:     Discharge Instructions      You were evaluated in the Emergency Department and after careful evaluation, we did not find any emergent condition requiring admission or further testing in the hospital.  Your exam/testing today is overall reassuring.  Pain may be related to muscle strain or spasm.  Use the Naprosyn anti-inflammatory twice daily for pain.  Use the Flexeril  muscle relaxer for more significant pain.  Follow-up with your primary care doctor.  Please return to the Emergency  Department if you experience any worsening of your condition.   Thank you for allowing us  to be a part of your care.     Edson Graces, MD 04/20/24 [phone]

## 2024-04-20 NOTE — Discharge Instructions (Addendum)
 You were evaluated in the Emergency Department and after careful evaluation, we did not find any emergent condition requiring admission or further testing in the hospital.  Your exam/testing today is overall reassuring.  Pain may be related to muscle strain or spasm.  Use the Naprosyn anti-inflammatory twice daily for pain.  Use the Flexeril  muscle relaxer for more significant pain.  Follow-up with your primary care doctor.  Please return to the Emergency Department if you experience any worsening of your condition.   Thank you for allowing us  to be a part of your care.
